# Patient Record
Sex: Female | Born: 1970 | Race: Black or African American | Hispanic: No | Marital: Single | Smoking: Current every day smoker
Health system: Southern US, Community
[De-identification: ages and names within clinical notes are randomized; demographics above are authoritative.]

## PROBLEM LIST (undated history)

## (undated) DIAGNOSIS — J45909 Unspecified asthma, uncomplicated: Secondary | ICD-10-CM

## (undated) HISTORY — PX: ANKLE FRACTURE SURGERY: SHX122

## (undated) HISTORY — PX: TONSILLECTOMY: SUR1361

## (undated) HISTORY — PX: TUBAL LIGATION: SHX77

---

## 1998-12-15 ENCOUNTER — Emergency Department (HOSPITAL_COMMUNITY): Admission: EM | Admit: 1998-12-15 | Discharge: 1998-12-15 | Payer: Self-pay | Admitting: Emergency Medicine

## 1999-01-08 ENCOUNTER — Encounter: Payer: Self-pay | Admitting: Emergency Medicine

## 1999-01-08 ENCOUNTER — Emergency Department (HOSPITAL_COMMUNITY): Admission: EM | Admit: 1999-01-08 | Discharge: 1999-01-08 | Payer: Self-pay | Admitting: Emergency Medicine

## 1999-07-25 ENCOUNTER — Other Ambulatory Visit: Admission: RE | Admit: 1999-07-25 | Discharge: 1999-07-25 | Payer: Self-pay | Admitting: Obstetrics

## 1999-10-13 ENCOUNTER — Emergency Department (HOSPITAL_COMMUNITY): Admission: EM | Admit: 1999-10-13 | Discharge: 1999-10-13 | Payer: Self-pay | Admitting: Emergency Medicine

## 1999-10-16 ENCOUNTER — Emergency Department (HOSPITAL_COMMUNITY): Admission: EM | Admit: 1999-10-16 | Discharge: 1999-10-16 | Payer: Self-pay | Admitting: Emergency Medicine

## 2001-09-06 ENCOUNTER — Emergency Department (HOSPITAL_COMMUNITY): Admission: EM | Admit: 2001-09-06 | Discharge: 2001-09-07 | Payer: Self-pay | Admitting: Emergency Medicine

## 2003-07-11 ENCOUNTER — Emergency Department (HOSPITAL_COMMUNITY): Admission: EM | Admit: 2003-07-11 | Discharge: 2003-07-12 | Payer: Self-pay | Admitting: Emergency Medicine

## 2003-10-29 ENCOUNTER — Emergency Department (HOSPITAL_COMMUNITY): Admission: EM | Admit: 2003-10-29 | Discharge: 2003-10-29 | Payer: Self-pay | Admitting: Emergency Medicine

## 2003-11-23 ENCOUNTER — Emergency Department (HOSPITAL_COMMUNITY): Admission: EM | Admit: 2003-11-23 | Discharge: 2003-11-23 | Payer: Self-pay | Admitting: Emergency Medicine

## 2008-03-24 ENCOUNTER — Emergency Department (HOSPITAL_COMMUNITY): Admission: EM | Admit: 2008-03-24 | Discharge: 2008-03-24 | Payer: Self-pay | Admitting: Family Medicine

## 2008-11-21 ENCOUNTER — Emergency Department (HOSPITAL_COMMUNITY): Admission: EM | Admit: 2008-11-21 | Discharge: 2008-11-21 | Payer: Self-pay | Admitting: Emergency Medicine

## 2008-12-28 ENCOUNTER — Emergency Department (HOSPITAL_COMMUNITY): Admission: EM | Admit: 2008-12-28 | Discharge: 2008-12-28 | Payer: Self-pay | Admitting: Family Medicine

## 2009-08-02 ENCOUNTER — Emergency Department (HOSPITAL_COMMUNITY): Admission: EM | Admit: 2009-08-02 | Discharge: 2009-08-02 | Payer: Self-pay | Admitting: Emergency Medicine

## 2009-09-01 ENCOUNTER — Emergency Department (HOSPITAL_COMMUNITY): Admission: EM | Admit: 2009-09-01 | Discharge: 2009-09-01 | Payer: Self-pay | Admitting: Family Medicine

## 2009-12-16 ENCOUNTER — Emergency Department (HOSPITAL_COMMUNITY)
Admission: EM | Admit: 2009-12-16 | Discharge: 2009-12-16 | Payer: Self-pay | Source: Home / Self Care | Admitting: Emergency Medicine

## 2010-01-16 ENCOUNTER — Ambulatory Visit (HOSPITAL_COMMUNITY)
Admission: RE | Admit: 2010-01-16 | Discharge: 2010-01-16 | Payer: Self-pay | Source: Home / Self Care | Attending: Obstetrics | Admitting: Obstetrics

## 2010-01-18 ENCOUNTER — Ambulatory Visit (HOSPITAL_COMMUNITY)
Admission: RE | Admit: 2010-01-18 | Discharge: 2010-01-18 | Payer: Self-pay | Source: Home / Self Care | Attending: Obstetrics | Admitting: Obstetrics

## 2010-01-20 ENCOUNTER — Inpatient Hospital Stay (HOSPITAL_COMMUNITY)
Admission: EM | Admit: 2010-01-20 | Discharge: 2010-01-22 | Payer: Self-pay | Attending: Orthopedic Surgery | Admitting: Orthopedic Surgery

## 2010-01-20 ENCOUNTER — Emergency Department (HOSPITAL_COMMUNITY)
Admission: EM | Admit: 2010-01-20 | Discharge: 2010-01-20 | Disposition: A | Discharge status: Other Acute Inpatient Hospital | Payer: Self-pay | Source: Home / Self Care | Admitting: Internal Medicine

## 2010-04-09 LAB — URINALYSIS, ROUTINE W REFLEX MICROSCOPIC
Protein, ur: NEGATIVE mg/dL
Specific Gravity, Urine: 1.022 (ref 1.005–1.030)
Urobilinogen, UA: 0.2 mg/dL (ref 0.0–1.0)

## 2010-04-09 LAB — CBC
HCT: 32.2 % — ABNORMAL LOW (ref 36.0–46.0)
HCT: 38.7 % (ref 36.0–46.0)
Hemoglobin: 13 g/dL (ref 12.0–15.0)
Hemoglobin: 12.2 g/dL (ref 12.0–15.0)
MCH: 31.1 pg (ref 26.0–34.0)
MCH: 30.3 pg (ref 26.0–34.0)
MCHC: 33.3 g/dL (ref 30.0–36.0)
MCHC: 33.6 g/dL (ref 30.0–36.0)
MCV: 93.3 fL (ref 78.0–100.0)
MCV: 92.5 fL (ref 78.0–100.0)
Platelets: 245 10*3/uL (ref 150–400)
RBC: 4.18 MIL/uL (ref 3.87–5.11)
RBC: 3.48 MIL/uL — ABNORMAL LOW (ref 3.87–5.11)
RBC: 4.02 MIL/uL (ref 3.87–5.11)
RDW: 12.8 % (ref 11.5–15.5)
RDW: 12.5 % (ref 11.5–15.5)
RDW: 12.6 % (ref 11.5–15.5)
WBC: 10.8 10*3/uL — ABNORMAL HIGH (ref 4.0–10.5)
WBC: 3.9 10*3/uL — ABNORMAL LOW (ref 4.0–10.5)

## 2010-04-09 LAB — URINE CULTURE
Colony Count: NO GROWTH
Culture  Setup Time: 201112250037

## 2010-04-09 LAB — DIFFERENTIAL
Lymphocytes Relative: 55 % — ABNORMAL HIGH (ref 12–46)
Lymphs Abs: 4.3 10*3/uL — ABNORMAL HIGH (ref 0.7–4.0)
Monocytes Relative: 6 % (ref 3–12)
Neutrophils Relative %: 36 % — ABNORMAL LOW (ref 43–77)

## 2010-04-09 LAB — TYPE AND SCREEN: ABO/RH(D): B POS

## 2010-04-09 LAB — PROTIME-INR
INR: 0.95 (ref 0.00–1.49)
Prothrombin Time: 12.4 seconds (ref 11.6–15.2)
Prothrombin Time: 12.9 seconds (ref 11.6–15.2)

## 2010-04-09 LAB — BASIC METABOLIC PANEL
BUN: 10 mg/dL (ref 6–23)
BUN: 4 mg/dL — ABNORMAL LOW (ref 6–23)
CO2: 27 mEq/L (ref 19–32)
Calcium: 8.7 mg/dL (ref 8.4–10.5)
Chloride: 107 mEq/L (ref 96–112)
Creatinine, Ser: 0.56 mg/dL (ref 0.4–1.2)
GFR calc non Af Amer: >60 mL/min (ref >60)
GFR calc non Af Amer: >60 mL/min (ref >60)
Potassium: 3.4 mEq/L — ABNORMAL LOW (ref 3.5–5.1)
Potassium: 3.5 mEq/L (ref 3.5–5.1)
Sodium: 137 mEq/L (ref 135–145)

## 2010-04-09 LAB — URINALYSIS, MICROSCOPIC ONLY
Specific Gravity, Urine: 1.021 (ref 1.005–1.030)
Urobilinogen, UA: 0.2 mg/dL (ref 0.0–1.0)
pH: 6 (ref 5.0–8.0)

## 2010-04-09 LAB — COMPREHENSIVE METABOLIC PANEL
ALT: 21 U/L (ref 0–35)
AST: 21 U/L (ref 0–37)
Albumin: 4 g/dL (ref 3.5–5.2)
Alkaline Phosphatase: 36 U/L — ABNORMAL LOW (ref 39–117)
Calcium: 9.2 mg/dL (ref 8.4–10.5)
Total Bilirubin: 0.4 mg/dL (ref 0.3–1.2)

## 2010-04-09 LAB — PREGNANCY, URINE: Preg Test, Ur: NEGATIVE

## 2010-04-09 LAB — ABO/RH: ABO/RH(D): B POS

## 2010-05-15 LAB — POCT I-STAT, CHEM 8
BUN: 5 mg/dL — ABNORMAL LOW (ref 6–23)
Creatinine, Ser: 0.8 mg/dL (ref 0.4–1.2)
Glucose, Bld: 96 mg/dL (ref 70–99)
Hemoglobin: 16 g/dL — ABNORMAL HIGH (ref 12.0–15.0)
Potassium: 3.6 mEq/L (ref 3.5–5.1)
Sodium: 140 mEq/L (ref 135–145)
TCO2: 27 mmol/L (ref 0–100)

## 2011-03-16 ENCOUNTER — Emergency Department (HOSPITAL_COMMUNITY): Payer: Medicaid Other

## 2011-03-16 ENCOUNTER — Emergency Department (HOSPITAL_COMMUNITY)
Admission: EM | Admit: 2011-03-16 | Discharge: 2011-03-16 | Disposition: A | Discharge status: Home / Self Care | Payer: Medicaid Other | Attending: Emergency Medicine | Admitting: Emergency Medicine

## 2011-03-16 ENCOUNTER — Encounter (HOSPITAL_COMMUNITY): Payer: Self-pay | Admitting: *Deleted

## 2011-03-16 DIAGNOSIS — N898 Other specified noninflammatory disorders of vagina: Secondary | ICD-10-CM | POA: Insufficient documentation

## 2011-03-16 DIAGNOSIS — Z79899 Other long term (current) drug therapy: Secondary | ICD-10-CM | POA: Insufficient documentation

## 2011-03-16 DIAGNOSIS — N949 Unspecified condition associated with female genital organs and menstrual cycle: Secondary | ICD-10-CM | POA: Insufficient documentation

## 2011-03-16 DIAGNOSIS — R109 Unspecified abdominal pain: Secondary | ICD-10-CM | POA: Insufficient documentation

## 2011-03-16 DIAGNOSIS — N739 Female pelvic inflammatory disease, unspecified: Secondary | ICD-10-CM

## 2011-03-16 DIAGNOSIS — R10817 Generalized abdominal tenderness: Secondary | ICD-10-CM | POA: Insufficient documentation

## 2011-03-16 DIAGNOSIS — F17200 Nicotine dependence, unspecified, uncomplicated: Secondary | ICD-10-CM | POA: Insufficient documentation

## 2011-03-16 LAB — WET PREP, GENITAL
Trich, Wet Prep: NONE SEEN
Yeast Wet Prep HPF POC: NONE SEEN

## 2011-03-16 LAB — LIPASE, BLOOD: Lipase: 39 U/L (ref 11–59)

## 2011-03-16 LAB — DIFFERENTIAL
Basophils Absolute: 0 10*3/uL (ref 0.0–0.1)
Basophils Relative: 0 % (ref 0–1)
Eosinophils Absolute: 0.4 10*3/uL (ref 0.0–0.7)
Eosinophils Relative: 7 % — ABNORMAL HIGH (ref 0–5)
Lymphocytes Relative: 50 % — ABNORMAL HIGH (ref 12–46)
Lymphs Abs: 2.8 10*3/uL (ref 0.7–4.0)
Monocytes Absolute: 0.4 10*3/uL (ref 0.1–1.0)
Monocytes Relative: 8 % (ref 3–12)
Neutro Abs: 2 10*3/uL (ref 1.7–7.7)
Neutrophils Relative %: 35 % — ABNORMAL LOW (ref 43–77)

## 2011-03-16 LAB — URINALYSIS, ROUTINE W REFLEX MICROSCOPIC
Bilirubin Urine: NEGATIVE
Glucose, UA: NEGATIVE mg/dL
Hgb urine dipstick: NEGATIVE
Ketones, ur: NEGATIVE mg/dL
Nitrite: NEGATIVE
Protein, ur: NEGATIVE mg/dL
Specific Gravity, Urine: 1.014 (ref 1.005–1.030)
Urobilinogen, UA: 0.2 mg/dL (ref 0.0–1.0)
pH: 7 (ref 5.0–8.0)

## 2011-03-16 LAB — CBC
HCT: 37.3 % (ref 36.0–46.0)
Hemoglobin: 12.8 g/dL (ref 12.0–15.0)
MCH: 31 pg (ref 26.0–34.0)
MCHC: 34.3 g/dL (ref 30.0–36.0)
MCV: 90.3 fL (ref 78.0–100.0)
Platelets: 290 10*3/uL (ref 150–400)
RBC: 4.13 MIL/uL (ref 3.87–5.11)
RDW: 12.7 % (ref 11.5–15.5)
WBC: 5.6 10*3/uL (ref 4.0–10.5)

## 2011-03-16 LAB — COMPREHENSIVE METABOLIC PANEL
ALT: 14 U/L (ref 0–35)
AST: 16 U/L (ref 0–37)
Albumin: 3.6 g/dL (ref 3.5–5.2)
Alkaline Phosphatase: 44 U/L (ref 39–117)
BUN: 7 mg/dL (ref 6–23)
CO2: 25 mEq/L (ref 19–32)
Calcium: 9.2 mg/dL (ref 8.4–10.5)
Chloride: 105 mEq/L (ref 96–112)
Creatinine, Ser: 0.6 mg/dL (ref 0.50–1.10)
GFR calc Af Amer: >90 mL/min (ref >90)
GFR calc non Af Amer: >90 mL/min (ref >90)
Glucose, Bld: 98 mg/dL (ref 70–99)
Potassium: 3.8 mEq/L (ref 3.5–5.1)
Sodium: 138 mEq/L (ref 135–145)
Total Bilirubin: 0.3 mg/dL (ref 0.3–1.2)
Total Protein: 6.6 g/dL (ref 6.0–8.3)

## 2011-03-16 LAB — URINE MICROSCOPIC-ADD ON

## 2011-03-16 MED ORDER — HYDROMORPHONE HCL PF 1 MG/ML IJ SOLN
1.0000 mg | Freq: Once | INTRAMUSCULAR | Status: AC
  Administered 2011-03-16: 1 mg via INTRAVENOUS
  Filled 2011-03-16: qty 1

## 2011-03-16 MED ORDER — DEXTROSE 5 % IV SOLN
INTRAVENOUS | Status: AC
  Administered 2011-03-16: 1 g via INTRAVENOUS
  Filled 2011-03-16: qty 10

## 2011-03-16 MED ORDER — ONDANSETRON HCL 4 MG/2ML IJ SOLN
4.0000 mg | Freq: Once | INTRAMUSCULAR | Status: AC
  Administered 2011-03-16: 4 mg via INTRAVENOUS
  Filled 2011-03-16 (×2): qty 2

## 2011-03-16 MED ORDER — IOHEXOL 300 MG/ML  SOLN
100.0000 mL | Freq: Once | INTRAMUSCULAR | Status: AC | PRN
  Administered 2011-03-16: 100 mL via INTRAVENOUS

## 2011-03-16 MED ORDER — OXYCODONE-ACETAMINOPHEN 5-325 MG PO TABS: 1.0000 | ORAL_TABLET | Freq: Four times a day (QID) | ORAL | Status: AC | PRN

## 2011-03-16 MED ORDER — DOXYCYCLINE HYCLATE 100 MG PO TABS
100.0000 mg | ORAL_TABLET | Freq: Once | ORAL | Status: AC
  Administered 2011-03-16: 100 mg via ORAL
  Filled 2011-03-16: qty 1

## 2011-03-16 MED ORDER — HYDROMORPHONE HCL PF 1 MG/ML IJ SOLN
1.0000 mg | Freq: Once | INTRAMUSCULAR | Status: AC
  Administered 2011-03-16: 1 mg via INTRAVENOUS
  Filled 2011-03-16 (×2): qty 1

## 2011-03-16 MED ORDER — CEFTRIAXONE SODIUM 250 MG IJ SOLR: 250.0000 mg | Freq: Once | INTRAMUSCULAR | Status: DC

## 2011-03-16 MED ORDER — ONDANSETRON HCL 4 MG/2ML IJ SOLN
4.0000 mg | Freq: Once | INTRAMUSCULAR | Status: AC
  Administered 2011-03-16: 4 mg via INTRAVENOUS
  Filled 2011-03-16: qty 2

## 2011-03-16 MED ORDER — METRONIDAZOLE 500 MG PO TABS
500.0000 mg | ORAL_TABLET | Freq: Once | ORAL | Status: AC
  Administered 2011-03-16: 500 mg via ORAL
  Filled 2011-03-16: qty 1

## 2011-03-16 MED ORDER — METRONIDAZOLE 500 MG PO TABS: 500.0000 mg | ORAL_TABLET | Freq: Two times a day (BID) | ORAL | Status: AC

## 2011-03-16 MED ORDER — DOXYCYCLINE HYCLATE 100 MG PO CAPS: 100.0000 mg | ORAL_CAPSULE | Freq: Two times a day (BID) | ORAL | Status: AC

## 2011-03-16 MED ORDER — OXYCODONE-ACETAMINOPHEN 5-325 MG PO TABS
1.0000 | ORAL_TABLET | Freq: Once | ORAL | Status: AC
  Administered 2011-03-16: 1 via ORAL
  Filled 2011-03-16: qty 1

## 2011-03-16 NOTE — Discharge Instructions (Signed)
Follow up with your GYN doctor. Return here as needed. I think you have an infection in your pelvic region.

## 2011-03-16 NOTE — ED Provider Notes (Signed)
History     CSN: [id number]  Arrival date & time 03/16/11  4098   First MD Initiated Contact with Patient 03/16/11 [phone]      Chief Complaint  Patient presents with  . Abdominal Pain  . Shoulder Pain    (Consider location/radiation/quality/duration/timing/severity/associated sxs/prior treatment) HPI The patient presents with lower abdominal pain since Thursday. She states that now her abdomen is painful generally at this point but mostly in her lower abdomen. The patient denies N/V/D, abdominal distention, urinary symptoms, weakness, fever, vaginal bleeding or discharge. She states that she has not taken any medication to help with her symptoms. She is sexauly active.  History reviewed. No pertinent past medical history.  History reviewed. No pertinent past surgical history.  History reviewed. No pertinent family history.  History  Substance Use Topics  . Smoking status: Current Everyday Smoker -- 1.0 packs/day    Types: Cigarettes  . Smokeless tobacco: Not on file  . Alcohol Use: No    OB History    Grav Para Term Preterm Abortions TAB SAB Ect Mult Living                  Review of Systems All pertinent positives and negatives reviewed in the history of present illness  Allergies  Nsaids  Home Medications   Current Outpatient Rx  Name Route Sig Dispense Refill  . DIPHENHYDRAMINE HCL 25 MG PO TABS Oral Take 25 mg by mouth every 6 (six) hours as needed. Allergies    . TRAMADOL HCL 50 MG PO TABS Oral Take 150 mg by mouth every 8 (eight) hours as needed. pain      BP 131/77  Pulse 73  Temp(Src) 98.1 F (36.7 C) (Oral)  Resp 20  SpO2 95%  LMP 02/17/2011  Physical Exam  Constitutional: She is oriented to person, place, and time. She appears well-developed and well-nourished. No distress.  HENT:  Head: Normocephalic and atraumatic.  Eyes: Pupils are equal, round, and reactive to light.  Neck: Normal range of motion. Neck supple.  Cardiovascular: Normal rate,  regular rhythm and normal heart sounds.  Exam reveals no gallop and no friction rub.   No murmur heard. Pulmonary/Chest: Effort normal and breath sounds normal. No respiratory distress. She has no rales.  Abdominal: Soft. Normal appearance and bowel sounds are normal. She exhibits no distension. There is no hepatosplenomegaly. There is generalized tenderness. There is no rigidity, no rebound and no guarding. No hernia.    Genitourinary: Vagina normal. Cervix exhibits discharge. Right adnexum displays tenderness. Left adnexum displays tenderness.  Neurological: She is alert and oriented to person, place, and time.  Skin: Skin is warm and dry. No rash noted.    ED Course  Procedures (including critical care time)  Labs Reviewed  DIFFERENTIAL - Abnormal; Notable for the following:    Neutrophils Relative 35 (*)    Lymphocytes Relative 50 (*)    Eosinophils Relative 7 (*)    All other components within normal limits  URINALYSIS, ROUTINE W REFLEX MICROSCOPIC - Abnormal; Notable for the following:    APPearance CLOUDY (*)    Leukocytes, UA TRACE (*)    All other components within normal limits  URINE MICROSCOPIC-ADD ON - Abnormal; Notable for the following:    Squamous Epithelial / LPF MANY (*)    All other components within normal limits  CBC  COMPREHENSIVE METABOLIC PANEL  LIPASE, BLOOD  WET PREP, GENITAL  GC/CHLAMYDIA PROBE AMP, GENITAL  PREGNANCY, URINE   Ct Abdomen  Pelvis W Contrast  03/16/2011  *RADIOLOGY REPORT*  Clinical Data: Abdominal pain  CT ABDOMEN AND PELVIS WITH CONTRAST  Technique:  Multidetector CT imaging of the abdomen and pelvis was performed following the standard protocol during bolus administration of intravenous contrast.  Contrast: OMNIPAQUE IOHEXOL 300 MG/ML IV SOLN  Comparison: None.  Findings: The lung bases are clear.  The liver demonstrates mild diffuse fatty infiltration.  No focal hepatic lesion or biliary ductal dilatation. The gallbladder, spleen,  adrenal glands, pancreas, and kidneys are within normal limits.  On delayed images, there is symmetric excretion of contrast both kidneys.  Ureters are normal in caliber.  The stomach is not distended and appears normal.  Small bowel loops are normal in caliber and wall thickness.  The terminal ileum appears normal. The appendix is normal. The colon is normal in caliber and wall thickness.  Normal rectum.  The urinary bladder is normal.  The uterus and adnexa are within normal limits.  There is no inflammatory stranding, ascites, or lymphadenopathy. Negative for free fluid.  Soft tissues of the body wall are unremarkable.  Vertebral bodies are normal in height and alignment. Sacroiliac joints and hips are within normal limits.  IMPRESSION:  1.  No acute findings in the abdomen or pelvis to explain the patient's pain. 2.  Mild fatty infiltration of the liver.  Original Report Authenticated By: Britta Mccreedy, M.D.    Based on her PE I feel that the patient has PID. The patient will be treated for this and referred to her GYN doctor for a recheck.     MDM   MDM Reviewed: nursing note and vitals Interpretation: labs and CT scan           Carlyle Dolly, PA-C 03/16/11 1614

## 2011-03-16 NOTE — ED Notes (Signed)
Reports having lower abd pain that woke her up, having a burning pain, denies urinary symptoms, denies vaginal symptoms or n/v/d. Also reports right shoulder pain, denies injury.

## 2011-03-16 NOTE — ED Notes (Signed)
Gave pt a blanket.8:25am JG.

## 2011-03-17 NOTE — ED Provider Notes (Signed)
Medical screening examination/treatment/procedure(s) were performed by non-physician practitioner and as supervising physician I was immediately available for consultation/collaboration.  Loren Racer, MD 03/17/11 [phone]

## 2011-03-18 LAB — GC/CHLAMYDIA PROBE AMP, GENITAL
Chlamydia, DNA Probe: NEGATIVE
GC Probe Amp, Genital: NEGATIVE

## 2011-03-18 LAB — POCT I-STAT TROPONIN I

## 2012-08-13 ENCOUNTER — Emergency Department (HOSPITAL_COMMUNITY)
Admission: EM | Admit: 2012-08-13 | Discharge: 2012-08-13 | Disposition: A | Discharge status: Home / Self Care | Payer: Self-pay | Attending: Emergency Medicine | Admitting: Emergency Medicine

## 2012-08-13 ENCOUNTER — Emergency Department (HOSPITAL_COMMUNITY): Payer: Self-pay

## 2012-08-13 ENCOUNTER — Encounter (HOSPITAL_COMMUNITY): Payer: Self-pay

## 2012-08-13 DIAGNOSIS — R059 Cough, unspecified: Secondary | ICD-10-CM

## 2012-08-13 DIAGNOSIS — M546 Pain in thoracic spine: Secondary | ICD-10-CM | POA: Insufficient documentation

## 2012-08-13 DIAGNOSIS — R05 Cough: Secondary | ICD-10-CM | POA: Insufficient documentation

## 2012-08-13 DIAGNOSIS — F17200 Nicotine dependence, unspecified, uncomplicated: Secondary | ICD-10-CM | POA: Insufficient documentation

## 2012-08-13 DIAGNOSIS — R0989 Other specified symptoms and signs involving the circulatory and respiratory systems: Secondary | ICD-10-CM | POA: Insufficient documentation

## 2012-08-13 DIAGNOSIS — R079 Chest pain, unspecified: Secondary | ICD-10-CM | POA: Insufficient documentation

## 2012-08-13 DIAGNOSIS — J45901 Unspecified asthma with (acute) exacerbation: Secondary | ICD-10-CM | POA: Insufficient documentation

## 2012-08-13 DIAGNOSIS — M549 Dorsalgia, unspecified: Secondary | ICD-10-CM

## 2012-08-13 HISTORY — DX: Unspecified asthma, uncomplicated: J45.909

## 2012-08-13 LAB — BASIC METABOLIC PANEL
CO2: 26 mEq/L (ref 19–32)
Chloride: 104 mEq/L (ref 96–112)
Sodium: 139 mEq/L (ref 135–145)

## 2012-08-13 LAB — CBC WITH DIFFERENTIAL/PLATELET
Basophils Absolute: 0 10*3/uL (ref 0.0–0.1)
HCT: 36.1 % (ref 36.0–46.0)
Lymphocytes Relative: 40 % (ref 12–46)
Lymphs Abs: 3.3 10*3/uL (ref 0.7–4.0)
Monocytes Absolute: 0.5 10*3/uL (ref 0.1–1.0)
Neutro Abs: 3.9 10*3/uL (ref 1.7–7.7)
Platelets: 275 10*3/uL (ref 150–400)
RBC: 3.97 MIL/uL (ref 3.87–5.11)
RDW: 13 % (ref 11.5–15.5)
WBC: 8.1 10*3/uL (ref 4.0–10.5)

## 2012-08-13 MED ORDER — TRAMADOL HCL 50 MG PO TABS: 50.0000 mg | ORAL_TABLET | Freq: Four times a day (QID) | ORAL | Status: DC | PRN

## 2012-08-13 MED ORDER — HYDROCODONE-ACETAMINOPHEN 5-325 MG PO TABS
1.0000 | ORAL_TABLET | Freq: Once | ORAL | Status: AC
  Administered 2012-08-13: 1 via ORAL
  Filled 2012-08-13: qty 1

## 2012-08-13 NOTE — ED Provider Notes (Signed)
History    CSN: [id number] Arrival date & time 08/13/12  1400  First MD Initiated Contact with Patient 08/13/12 1512     Chief Complaint  Patient presents with  . Shortness of Breath  . Cough  . Chest Pain   (Consider location/radiation/quality/duration/timing/severity/associated sxs/prior Treatment) Patient is a 42 y.o. female presenting with shortness of breath, cough, and chest pain. The history is provided by the patient.  Shortness of Breath Associated symptoms: chest pain and cough   Associated symptoms: no abdominal pain, no fever, no headaches, no neck pain, no rash and no vomiting   Cough Associated symptoms: chest pain and shortness of breath   Associated symptoms: no chills, no fever, no headaches and no rash   Chest Pain Associated symptoms: cough and shortness of breath   Associated symptoms: no abdominal pain, no fever, no headache and not vomiting   pt c/o right sided chest pain, non productive cough, chest congestion, onset in past day. States non productive cough, no hemoptysis.  w coughing spell, notes right upper back pain, no anterior cp.  No change w activity or exertion. No associated sob, nv or diaphoresis. No fever or chills. Denies back or chest wall strain. Denies leg pain or swelling. No recent immobility, trauma, surgery, or travel. No hx dvt or pe. Smoker. No family hx premature cad. Pt notes hx asthma. States last exacerbation was 1-2 years ago.    Past Medical History  Diagnosis Date  . Asthma    Past Surgical History  Procedure Laterality Date  . Tubal ligation    . Ankle fracture surgery    . Tonsillectomy     Family History  Problem Relation Age of Onset  . Hypertension Mother    History  Substance Use Topics  . Smoking status: Current Every Day Smoker -- 1.00 packs/day    Types: Cigarettes  . Smokeless tobacco: Never Used  . Alcohol Use: No   OB History   Grav Para Term Preterm Abortions TAB SAB Ect Mult Living                  Review of Systems  Constitutional: Negative for fever and chills.  HENT: Negative for neck pain.   Eyes: Negative for redness.  Respiratory: Positive for cough and shortness of breath.   Cardiovascular: Positive for chest pain. Negative for leg swelling.  Gastrointestinal: Negative for vomiting and abdominal pain.  Genitourinary: Negative for flank pain.  Musculoskeletal: Negative for arthralgias.  Skin: Negative for rash.  Neurological: Negative for headaches.  Hematological: Does not bruise/bleed easily.  Psychiatric/Behavioral: Negative for confusion.    Allergies  Nsaids  Home Medications   Current Outpatient Rx  Name  Route  Sig  Dispense  Refill  . diphenhydrAMINE (BENADRYL) 25 MG tablet   Oral   Take 25 mg by mouth every 6 (six) hours as needed for allergies.           BP 136/83  Pulse 82  Temp(Src) 99.4 F (37.4 C) (Oral)  Resp 15  Ht 5' 6.5" (1.689 m)  Wt 190 lb (86.183 kg)  BMI 30.21 kg/m2  SpO2 100%  LMP 08/13/2012 Physical Exam  Nursing note and vitals reviewed. Constitutional: She appears well-developed and well-nourished. No distress.  HENT:  Mouth/Throat: Oropharynx is clear and moist.  Eyes: Conjunctivae are normal. No scleral icterus.  Neck: Neck supple. No tracheal deviation present.  Cardiovascular: Normal rate, regular rhythm, normal heart sounds and intact distal pulses.  Exam reveals  no gallop and no friction rub.   No murmur heard. Pulmonary/Chest: Effort normal and breath sounds normal. No respiratory distress. She exhibits no tenderness.  Abdominal: Soft. Normal appearance and bowel sounds are normal. She exhibits no distension. There is no tenderness.  Genitourinary:  No cva tenderness  Musculoskeletal: She exhibits no edema and no tenderness.  Right upper back muscular tenderness.   Neurological: She is alert.  Skin: Skin is warm and dry. No rash noted. She is not diaphoretic.  Psychiatric: She has a normal mood and affect.     ED Course  Procedures (including critical care time)  Results for orders placed during the hospital encounter of 08/13/12  CBC WITH DIFFERENTIAL      Result Value Range   WBC 8.1  4.0 - 10.5 K/uL   RBC 3.97  3.87 - 5.11 MIL/uL   Hemoglobin 12.3  12.0 - 15.0 g/dL   HCT 45.4  09.8 - 11.9 %   MCV 90.9  78.0 - 100.0 fL   MCH 31.0  26.0 - 34.0 pg   MCHC 34.1  30.0 - 36.0 g/dL   RDW 14.7  82.9 - 56.2 %   Platelets 275  150 - 400 K/uL   Neutrophils Relative % 48  43 - 77 %   Neutro Abs 3.9  1.7 - 7.7 K/uL   Lymphocytes Relative 40  12 - 46 %   Lymphs Abs 3.3  0.7 - 4.0 K/uL   Monocytes Relative 6  3 - 12 %   Monocytes Absolute 0.5  0.1 - 1.0 K/uL   Eosinophils Relative 5  0 - 5 %   Eosinophils Absolute 0.4  0.0 - 0.7 K/uL   Basophils Relative 0  0 - 1 %   Basophils Absolute 0.0  0.0 - 0.1 K/uL  BASIC METABOLIC PANEL      Result Value Range   Sodium 139  135 - 145 mEq/L   Potassium 4.0  3.5 - 5.1 mEq/L   Chloride 104  96 - 112 mEq/L   CO2 26  19 - 32 mEq/L   Glucose, Bld 147 (*) 70 - 99 mg/dL   BUN 8  6 - 23 mg/dL   Creatinine, Ser 1.30  0.50 - 1.10 mg/dL   Calcium 9.5  8.4 - 86.5 mg/dL   GFR calc non Af Amer >90  >90 mL/min   GFR calc Af Amer >90  >90 mL/min  TROPONIN I      Result Value Range   Troponin I <0.30  <0.30 ng/mL   Dg Chest 2 View  08/13/2012   *RADIOLOGY REPORT*  Clinical Data: Shortness of breath and cough  CHEST - 2 VIEW  Comparison: 01/20/2010  Findings: The heart size and mediastinal contours are within normal limits.  Both lungs are clear.  The visualized skeletal structures are unremarkable.  IMPRESSION: No active cardiopulmonary abnormalities.   Original Report Authenticated By: Signa Kell, M.D.     MDM  Iv ns. Labs. Cxr.   Date: 08/13/2012  Rate: 84  Rhythm: normal sinus rhythm  QRS Axis: normal  Intervals: normal  ST/T Wave abnormalities: normal  Conduction Disutrbances:none  Narrative Interpretation:   Old EKG Reviewed:  unchanged  Reviewed nursing notes and prior charts for additional history.   Pt states has ride, does not have to drive.  Allergy to nsaids. vicodin 1 po.  Pt resting comfortably on recheck. Hr 70, rr 14, pulse ox 99% room air.  Pt appears stable for d/c.  Suzi Roots, MD 08/13/12 [phone]

## 2012-08-13 NOTE — ED Notes (Addendum)
Patient reports non productive cough that began last night and right chest pain.  Patient states she coughs with a deep breath.Lungs clear. Patient states she called EMS last pm and EMS gave her a neb treatment and she was breathiig better when they left.

## 2012-10-23 ENCOUNTER — Encounter (HOSPITAL_COMMUNITY): Payer: Self-pay

## 2012-10-23 ENCOUNTER — Emergency Department (HOSPITAL_COMMUNITY)
Admission: EM | Admit: 2012-10-23 | Discharge: 2012-10-23 | Disposition: A | Discharge status: Home / Self Care | Payer: Medicaid Other | Attending: Emergency Medicine | Admitting: Emergency Medicine

## 2012-10-23 ENCOUNTER — Emergency Department (HOSPITAL_COMMUNITY): Payer: Medicaid Other

## 2012-10-23 DIAGNOSIS — F17200 Nicotine dependence, unspecified, uncomplicated: Secondary | ICD-10-CM | POA: Insufficient documentation

## 2012-10-23 DIAGNOSIS — R6883 Chills (without fever): Secondary | ICD-10-CM | POA: Insufficient documentation

## 2012-10-23 DIAGNOSIS — Z888 Allergy status to other drugs, medicaments and biological substances status: Secondary | ICD-10-CM | POA: Insufficient documentation

## 2012-10-23 DIAGNOSIS — R079 Chest pain, unspecified: Secondary | ICD-10-CM | POA: Insufficient documentation

## 2012-10-23 DIAGNOSIS — J019 Acute sinusitis, unspecified: Secondary | ICD-10-CM | POA: Insufficient documentation

## 2012-10-23 DIAGNOSIS — J45909 Unspecified asthma, uncomplicated: Secondary | ICD-10-CM | POA: Insufficient documentation

## 2012-10-23 DIAGNOSIS — Z79899 Other long term (current) drug therapy: Secondary | ICD-10-CM | POA: Insufficient documentation

## 2012-10-23 DIAGNOSIS — J209 Acute bronchitis, unspecified: Secondary | ICD-10-CM | POA: Insufficient documentation

## 2012-10-23 MED ORDER — ALBUTEROL SULFATE (5 MG/ML) 0.5% IN NEBU
5.0000 mg | INHALATION_SOLUTION | Freq: Once | RESPIRATORY_TRACT | Status: AC
  Administered 2012-10-23: 5 mg via RESPIRATORY_TRACT
  Filled 2012-10-23: qty 1

## 2012-10-23 MED ORDER — PREDNISONE 20 MG PO TABS: 60.0000 mg | ORAL_TABLET | Freq: Every day | ORAL | Status: DC

## 2012-10-23 MED ORDER — AZITHROMYCIN 250 MG PO TABS: 250.0000 mg | ORAL_TABLET | Freq: Every day | ORAL | Status: DC

## 2012-10-23 MED ORDER — ALBUTEROL SULFATE HFA 108 (90 BASE) MCG/ACT IN AERS: 1.0000 | INHALATION_SPRAY | Freq: Four times a day (QID) | RESPIRATORY_TRACT | Status: DC | PRN

## 2012-10-23 NOTE — ED Provider Notes (Signed)
CSN: [id number]     Arrival date & time 10/23/12  1245 History  This chart was scribed for non-physician practitioner Magnus Sinning, PA-C working with Loren Racer, MD by Danella Maiers, ED Scribe. This patient was seen in room TR06C/TR06C and the patient's care was started at 2:03 PM.     Chief Complaint  Patient presents with  . Cough  . Nasal Congestion   The history is provided by the patient. No language interpreter was used.   HPI Comments: Tricia Hood is a 42 y.o. female with a history of asthma who presents to the Emergency Department complaining of CP only when she coughs onset 4 days ago. She also reports sinus pain, chills, and congestion. She reports yellow sputum when she coughs. She denies h/o chronic bronchitis. She is out of her albuterol inhaler. She has not taken anything for her symptoms prior to arrival.  She denies fever. She feels better after receiving a breathing treatment in Triage.  She currently smokes 1 ppd.   Past Medical History  Diagnosis Date  . Asthma    Past Surgical History  Procedure Laterality Date  . Tubal ligation    . Ankle fracture surgery    . Tonsillectomy     Family History  Problem Relation Age of Onset  . Hypertension Mother    History  Substance Use Topics  . Smoking status: Current Every Day Smoker -- 1.00 packs/day    Types: Cigarettes  . Smokeless tobacco: Never Used  . Alcohol Use: No   OB History   Grav Para Term Preterm Abortions TAB SAB Ect Mult Living                 Review of Systems  Constitutional: Positive for chills. Negative for fever.  HENT: Positive for congestion.   Respiratory: Positive for cough.   All other systems reviewed and are negative.    Allergies  Nsaids  Home Medications   Current Outpatient Rx  Name  Route  Sig  Dispense  Refill  . albuterol (PROVENTIL HFA;VENTOLIN HFA) 108 (90 BASE) MCG/ACT inhaler   Inhalation   Inhale 2 puffs into the lungs every 6 (six) hours as  needed for wheezing.         Marland Kitchen OVER THE COUNTER MEDICATION   Oral   Take 1 tablet by mouth daily as needed (alleregies).          BP 136/98  Pulse 86  Temp(Src) 98.1 F (36.7 C) (Oral)  Resp 16  Ht 5\' 5"  (1.651 m)  Wt 190 lb (86.183 kg)  BMI 31.62 kg/m2  SpO2 99%  LMP 09/16/2012 Physical Exam  Nursing note and vitals reviewed. Constitutional: She appears well-developed and well-nourished.  HENT:  Head: Normocephalic and atraumatic.  Right Ear: Tympanic membrane normal.  Left Ear: Tympanic membrane normal.  Mouth/Throat: Oropharynx is clear and moist.  Tenderness to palpation of the maxillary and frontal sinuses.  Eyes: EOM are normal. Pupils are equal, round, and reactive to light.  Neck: Normal range of motion. Neck supple.  Cardiovascular: Normal rate, regular rhythm and normal heart sounds.   Pulmonary/Chest: Effort normal and breath sounds normal. She has no wheezes.  rhonchi at the base of the lungs. Late inspiratory wheeze at the base of the left lung.  Musculoskeletal: Normal range of motion.  Neurological: She is alert.  Skin: Skin is warm and dry.  Psychiatric: She has a normal mood and affect. Her behavior is normal.  ED Course  Procedures (including critical care time) Medications  albuterol (PROVENTIL) (5 MG/ML) 0.5% nebulizer solution 5 mg (5 mg Nebulization Given 10/23/12 1315)   DIAGNOSTIC STUDIES: Oxygen Saturation is 99% on room air, normal by my interpretation.    COORDINATION OF CARE: 2:50 PM- Discussed treatment plan with pt which includes CXR and albuterol inhaler and pt agrees to plan.   Labs Review Labs Reviewed - No data to display Imaging Review Dg Chest 2 View  10/23/2012   CLINICAL DATA:  Cough. Congestion. Tobacco use.  EXAM: CHEST  2 VIEW  COMPARISON:  08/13/2012  FINDINGS: The heart size and mediastinal contours are within normal limits. Both lungs are clear. The visualized skeletal structures are unremarkable.  IMPRESSION: No  active cardiopulmonary disease.   Electronically Signed   By: Herbie Baltimore   On: 10/23/2012 13:35    MDM  No diagnosis found. Patient with symptoms consistent with Acute Sinusitis and Acute Bronchitis.  Patient given prescription for Azithromycin, Albuterol, and Prednisone.  Pulse ox 99.  No signs of respiratory distress.  Patient stable for discharge.  I personally performed the services described in this documentation, which was scribed in my presence. The recorded information has been reviewed and is accurate.    Pascal Lux Chester, PA-C 10/23/12 [phone]

## 2012-10-23 NOTE — ED Notes (Signed)
Pt c/o productive cough, nasal congestion, and chills x4 days

## 2012-10-23 NOTE — ED Notes (Signed)
Patient transported to X-ray 

## 2012-10-25 NOTE — ED Provider Notes (Signed)
Medical screening examination/treatment/procedure(s) were performed by non-physician practitioner and as supervising physician I was immediately available for consultation/collaboration.   Conall Vangorder, MD 10/25/12 1506 

## 2013-03-08 ENCOUNTER — Ambulatory Visit (INDEPENDENT_AMBULATORY_CARE_PROVIDER_SITE_OTHER): Payer: Managed Care, Other (non HMO) | Admitting: Family Medicine

## 2013-03-08 VITALS — BP 122/78 | HR 86 | Temp 97.9°F | Resp 16 | Ht 65.5 in | Wt 224.6 lb

## 2013-03-08 DIAGNOSIS — L299 Pruritus, unspecified: Secondary | ICD-10-CM

## 2013-03-08 DIAGNOSIS — T7840XA Allergy, unspecified, initial encounter: Secondary | ICD-10-CM

## 2013-03-08 MED ORDER — PREDNISONE 20 MG PO TABS: ORAL_TABLET | ORAL | Status: DC

## 2013-03-08 MED ORDER — METHYLPREDNISOLONE ACETATE 80 MG/ML IJ SUSP
40.0000 mg | Freq: Once | INTRAMUSCULAR | Status: AC
  Administered 2013-03-08: 40 mg via INTRAMUSCULAR

## 2013-03-08 MED ORDER — HYDROXYZINE HCL 25 MG PO TABS: 25.0000 mg | ORAL_TABLET | Freq: Every evening | ORAL | Status: DC | PRN

## 2013-03-08 NOTE — Progress Notes (Signed)
Tricia Hood is a 43 y.o. female who presents to Urgent Care today with complaints of itching and hives:  1.  Itching and hives:  Started on Firday.  Was at her mother's house who made fish, which she is allergic to.  Did not eat any but unsure if she cooked with same utensils.  Started Friday night with hives upper extremities, back, shoulders.  Progressed over the weekend to include face and legs.  No relief, unable to sleep at night due to itching.  Dry mouth but no trouble with airway.  Eating and drinking well.  Some decreased urination since starting the Benadryl.  Has taken 25 mg Benadryl every 6 hours for past 3 days without relief.  No nausea or vomitng.    Used inhaler 3 times today, which is normal for her.  No increased SOB since this stated  PMH reviewed.  Past Medical History  Diagnosis Date  . Asthma    Past Surgical History  Procedure Laterality Date  . Tubal ligation    . Ankle fracture surgery    . Tonsillectomy      Medications reviewed. Current Outpatient Prescriptions  Medication Sig Dispense Refill  . albuterol (PROVENTIL HFA;VENTOLIN HFA) 108 (90 BASE) MCG/ACT inhaler Inhale 2 puffs into the lungs every 6 (six) hours as needed for wheezing.      Marland Kitchen. albuterol (PROVENTIL HFA;VENTOLIN HFA) 108 (90 BASE) MCG/ACT inhaler Inhale 1-2 puffs into the lungs every 6 (six) hours as needed for wheezing.  1 Inhaler  0  . azithromycin (ZITHROMAX) 250 MG tablet Take 1 tablet (250 mg total) by mouth daily. Take first 2 tablets together, then 1 every day until finished.  6 tablet  0  . OVER THE COUNTER MEDICATION Take 1 tablet by mouth daily as needed (alleregies).      . predniSONE (DELTASONE) 20 MG tablet Take 3 tablets (60 mg total) by mouth daily.  15 tablet  0   No current facility-administered medications for this visit.    ROS as above otherwise neg.  No chest pain, palpitations, SOB, Fever, Chills, Abd pain, N/V/D.   Physical Exam:  BP 122/78  Pulse 86   Temp(Src) 97.9 F (36.6 C) (Oral)  Resp 16  Ht 5' 5.5" (1.664 m)  Wt 224 lb 9.6 oz (101.878 kg)  BMI 36.79 kg/m2  SpO2 99%  LMP 03/05/2013 Gen:  Alert, cooperative patient who appears stated age in no acute distress.  Vital signs reviewed. HEENT: EOMI.  Dry mucus membranes.  Tonsils +1 BL.  No other glandular swelling noted. Neck:  Trachea midline Pulm:  Clear to auscultation bilaterally with good air movement.  No wheezes or rales noted.  No increased WOB Skin:  Multiple patches of urticaria and papules scattered across upper chest, back, BL arms, legs.  Also with papules scattered across face.     Assessment and Plan: 1.  Allergic reaction: - likely to fish, though unknown - Depomedrol provided here in clinic - Prednisone taper outpatient basis plus Atarax for relief - no evidence of airway/respiratory involvement.    2.  Antihistamine overuse: - anticholinergic symptoms Stop all benadryl. Atarax PRN QHS for bad itching

## 2013-03-08 NOTE — Patient Instructions (Signed)
Talke the Prednisone as directed on the bottle.  Take the Atarax at night if you need it.  Stop the Benadryl.  If you start having any trouble breathing or swallowing go to the ED immediately.

## 2013-03-09 ENCOUNTER — Telehealth: Payer: Self-pay

## 2013-03-09 MED ORDER — HYDROXYZINE HCL 25 MG PO TABS: 25.0000 mg | ORAL_TABLET | Freq: Every evening | ORAL | Status: DC | PRN

## 2013-03-09 NOTE — Telephone Encounter (Signed)
Pt is needing to talk with someone about a prescribtion she was prescribed yesterday that is not at the pharmacy it is for itching   Best number [phone]

## 2013-03-09 NOTE — Telephone Encounter (Signed)
Pt called back- her pharm did not receive the rx for Vistaril. Resent.

## 2013-03-09 NOTE — Telephone Encounter (Signed)
Left message to return call 

## 2013-09-30 ENCOUNTER — Encounter (HOSPITAL_COMMUNITY): Payer: Self-pay | Admitting: Emergency Medicine

## 2013-09-30 ENCOUNTER — Emergency Department (HOSPITAL_COMMUNITY)
Admission: EM | Admit: 2013-09-30 | Discharge: 2013-09-30 | Disposition: A | Discharge status: Home / Self Care | Payer: Medicaid Other | Attending: Emergency Medicine | Admitting: Emergency Medicine

## 2013-09-30 DIAGNOSIS — T148XXA Other injury of unspecified body region, initial encounter: Principal | ICD-10-CM

## 2013-09-30 DIAGNOSIS — F17200 Nicotine dependence, unspecified, uncomplicated: Secondary | ICD-10-CM | POA: Insufficient documentation

## 2013-09-30 DIAGNOSIS — Z792 Long term (current) use of antibiotics: Secondary | ICD-10-CM | POA: Insufficient documentation

## 2013-09-30 DIAGNOSIS — S6990XA Unspecified injury of unspecified wrist, hand and finger(s), initial encounter: Secondary | ICD-10-CM | POA: Insufficient documentation

## 2013-09-30 DIAGNOSIS — Y9389 Activity, other specified: Secondary | ICD-10-CM | POA: Insufficient documentation

## 2013-09-30 DIAGNOSIS — IMO0002 Reserved for concepts with insufficient information to code with codable children: Secondary | ICD-10-CM | POA: Insufficient documentation

## 2013-09-30 DIAGNOSIS — Z79899 Other long term (current) drug therapy: Secondary | ICD-10-CM | POA: Insufficient documentation

## 2013-09-30 DIAGNOSIS — J45909 Unspecified asthma, uncomplicated: Secondary | ICD-10-CM | POA: Insufficient documentation

## 2013-09-30 DIAGNOSIS — Y9289 Other specified places as the place of occurrence of the external cause: Secondary | ICD-10-CM | POA: Insufficient documentation

## 2013-09-30 DIAGNOSIS — L089 Local infection of the skin and subcutaneous tissue, unspecified: Secondary | ICD-10-CM

## 2013-09-30 MED ORDER — TRAMADOL HCL 50 MG PO TABS
50.0000 mg | ORAL_TABLET | Freq: Once | ORAL | Status: AC
  Administered 2013-09-30: 50 mg via ORAL
  Filled 2013-09-30: qty 1

## 2013-09-30 MED ORDER — HYDROCODONE-ACETAMINOPHEN 5-325 MG PO TABS: 1.0000 | ORAL_TABLET | ORAL | Status: DC | PRN

## 2013-09-30 MED ORDER — CEPHALEXIN 250 MG PO CAPS
500.0000 mg | ORAL_CAPSULE | Freq: Once | ORAL | Status: AC
  Administered 2013-09-30: 500 mg via ORAL
  Filled 2013-09-30: qty 2

## 2013-09-30 MED ORDER — CEPHALEXIN 500 MG PO CAPS: 500.0000 mg | ORAL_CAPSULE | Freq: Four times a day (QID) | ORAL | Status: DC

## 2013-09-30 NOTE — ED Provider Notes (Signed)
CSN: [id number]     Arrival date & time 09/30/13  1734 History  This chart was scribed for a non-physician practitioner, [name], PA-C working with [name], MD by [place] Peace, ED Scribe. The patient was seen in [facility]. The patient's care was started at 6:08 PM.    Chief Complaint  Patient presents with  . Hand Injury      Patient is a 43 y.o. female presenting with hand injury. The history is provided by the patient. No language interpreter was used.  Hand Injury Location:  Hand Time since incident:  2 weeks Injury: yes   Hand location:  R hand Pain details:    Radiates to:  R shoulder   Severity:  Severe   Timing:  Constant Dislocation: no   Foreign body present:  Glass Tetanus status:  Unknown Associated symptoms: no back pain, no decreased range of motion, no fever, no muscle weakness, no neck pain and no numbness    HPI Comments: Tricia Hood is a 43 y.o. female who presents to the Emergency Department complaining of right hand pain onset 2 weeks ago with associated swelling that occurred after pt punched/hit a glass vase due to being angry over something that she did not specificy. . Pt does have small closed wound to palmar aspect of right hand which she states she did clean out effectively believes it could possibly be infected. Pt expresses her concern for possibly having pieces of glass still inside the affected area. She goes on to state that she works at [employer] and has to use her hands frequently.    Past Medical History  Diagnosis Date  . Asthma    Past Surgical History  Procedure Laterality Date  . Tubal ligation    . Ankle fracture surgery    . Tonsillectomy     Family History  Problem Relation Age of Onset  . Hypertension Mother    History  Substance Use Topics  . Smoking status: Current Every Day Smoker -- 1.00 packs/day    Types: Cigarettes  . Smokeless tobacco: Never Used  . Alcohol Use: No   OB History   Grav Para  Term Preterm Abortions TAB SAB Ect Mult Living                 Review of Systems  Constitutional: Negative for fever and chills.  Gastrointestinal: Negative for nausea, vomiting and diarrhea.  Musculoskeletal: Negative for back pain and neck pain.       Right hand pain with associated swelling.   Skin: Positive for wound (closed (potential infection)).  All other systems reviewed and are negative.     Allergies  Nsaids  Home Medications   Prior to Admission medications   Medication Sig Start Date End Date Taking? Authorizing Provider  albuterol (PROVENTIL HFA;VENTOLIN HFA) 108 (90 BASE) MCG/ACT inhaler Inhale 2 puffs into the lungs every 6 (six) hours as needed for wheezing.    Historical Provider, MD  albuterol (PROVENTIL HFA;VENTOLIN HFA) 108 (90 BASE) MCG/ACT inhaler Inhale 1-2 puffs into the lungs every 6 (six) hours as needed for wheezing. 10/23/12   Santiago Glad, PA-C  azithromycin (ZITHROMAX) 250 MG tablet Take 1 tablet (250 mg total) by mouth daily. Take first 2 tablets together, then 1 every day until finished. 10/23/12   Heather Laisure, PA-C  hydrOXYzine (ATARAX/VISTARIL) 25 MG tablet Take 1 tablet (25 mg total) by mouth at bedtime and may repeat dose one time if needed. 03/09/13   Geroge Baseman  Marte, PA-C  OVER THE COUNTER MEDICATION Take 1 tablet by mouth daily as needed (alleregies).    Historical Provider, MD  predniSONE (DELTASONE) 20 MG tablet Take 3 tabs po daily x 3 days, 2 tabs po daily x 3 days, 1 tab po daily x 3 days then stop 03/08/13   Tobey Grim, MD   BP 140/92  Pulse 81  Temp(Src) 98 F (36.7 C) (Oral)  Resp 16  SpO2 99%  LMP 08/28/2013 Physical Exam  Nursing note and vitals reviewed. Constitutional: She is oriented to person, place, and time. She appears well-developed and well-nourished. No distress.  HENT:  Head: Normocephalic and atraumatic.  Eyes: Conjunctivae and EOM are normal.  Neck: Neck supple. No tracheal deviation present.   Cardiovascular: Normal rate.   Pulmonary/Chest: Effort normal. No respiratory distress.  Musculoskeletal: Normal range of motion. She exhibits tenderness.  No obvious deformity of right hand. Full ROM of fingers.   Neurological: She is alert and oriented to person, place, and time.  Skin: Skin is warm and dry. There is erythema.  2 x 2 area of erythema and tenderness to palpation with overlying callus at base of 5th MCP of volar aspect of right hand. No open wounds.   Psychiatric: She has a normal mood and affect. Her behavior is normal.    ED Course  Procedures (including critical care time) Labs Review Labs Reviewed - No data to display  Results for orders placed during the hospital encounter of 08/13/12  CBC WITH DIFFERENTIAL      Result Value Ref Range   WBC 8.1  4.0 - 10.5 K/uL   RBC 3.97  3.87 - 5.11 MIL/uL   Hemoglobin 12.3  12.0 - 15.0 g/dL   HCT 81.1  91.4 - 78.2 %   MCV 90.9  78.0 - 100.0 fL   MCH 31.0  26.0 - 34.0 pg   MCHC 34.1  30.0 - 36.0 g/dL   RDW 95.6  21.3 - 08.6 %   Platelets 275  150 - 400 K/uL   Neutrophils Relative % 48  43 - 77 %   Neutro Abs 3.9  1.7 - 7.7 K/uL   Lymphocytes Relative 40  12 - 46 %   Lymphs Abs 3.3  0.7 - 4.0 K/uL   Monocytes Relative 6  3 - 12 %   Monocytes Absolute 0.5  0.1 - 1.0 K/uL   Eosinophils Relative 5  0 - 5 %   Eosinophils Absolute 0.4  0.0 - 0.7 K/uL   Basophils Relative 0  0 - 1 %   Basophils Absolute 0.0  0.0 - 0.1 K/uL  BASIC METABOLIC PANEL      Result Value Ref Range   Sodium 139  135 - 145 mEq/L   Potassium 4.0  3.5 - 5.1 mEq/L   Chloride 104  96 - 112 mEq/L   CO2 26  19 - 32 mEq/L   Glucose, Bld 147 (*) 70 - 99 mg/dL   BUN 8  6 - 23 mg/dL   Creatinine, Ser 5.78  0.50 - 1.10 mg/dL   Calcium 9.5  8.4 - 46.9 mg/dL   GFR calc non Af Amer >90  >90 mL/min   GFR calc Af Amer >90  >90 mL/min  TROPONIN I      Result Value Ref Range   Troponin I <0.30  <0.30 ng/mL   No results found.    Imaging Review No  results found.   EKG Interpretation None  Medications - No data to display  6:13 PM- Treatment plan was discussed with patient who verbalizes understanding and agrees.   MDM   Final diagnoses:  Skin infection    9:08 PM Patient appears to have a localized infection where she was cut by glass 2 weeks ago. I doubt any retained glass at this time. Patient refuses xray which may or may not show any possible retained glass. No tendon or ligament injury. Patient will have keflex and vicodin. Patient instructed to return with worsening or concerning symptoms.   I personally performed the services described in this documentation, which was scribed in my presence. The recorded information has been reviewed and is accurate.    Emilia Beck, PA-C 09/30/13 2110

## 2013-09-30 NOTE — ED Notes (Signed)
Kaitlyn PA at bedside  

## 2013-09-30 NOTE — ED Notes (Signed)
Pt c/o right hand pain that radiates to right shoulder.  Pt punched/hit glass vase with right hand approximately two weeks ago.  Pt states she felt like she got all the glass out of the affected area of the right hand but hand is painful with slight swelling on right hand.

## 2013-09-30 NOTE — ED Provider Notes (Signed)
Medical screening examination/treatment/procedure(s) were performed by non-physician practitioner and as supervising physician I was immediately available for consultation/collaboration.   EKG Interpretation None        Shellby Schlink, MD 09/30/13 2327 

## 2013-09-30 NOTE — ED Notes (Signed)
Declined W/C at D/C and was escorted to lobby by RN. 

## 2013-09-30 NOTE — Discharge Instructions (Signed)
Take Keflex as directed until gone. These are your antibiotics. Take vicodin as needed for pain. Return to the ED with worsening or concerning symptoms.

## 2014-10-07 ENCOUNTER — Encounter (HOSPITAL_COMMUNITY): Payer: Self-pay | Admitting: Emergency Medicine

## 2014-10-07 ENCOUNTER — Emergency Department (HOSPITAL_COMMUNITY): Payer: Self-pay

## 2014-10-07 ENCOUNTER — Encounter (HOSPITAL_COMMUNITY): Payer: Self-pay | Admitting: *Deleted

## 2014-10-07 ENCOUNTER — Inpatient Hospital Stay (HOSPITAL_COMMUNITY)
Admission: EM | Admit: 2014-10-07 | Discharge: 2014-10-10 | DRG: 871 | Disposition: A | Discharge status: Home / Self Care | Payer: Self-pay | Attending: Family Medicine | Admitting: Family Medicine

## 2014-10-07 ENCOUNTER — Emergency Department (HOSPITAL_COMMUNITY)
Admission: EM | Admit: 2014-10-07 | Discharge: 2014-10-07 | Discharge status: Against Medical Advice | Payer: Medicaid Other | Attending: Emergency Medicine | Admitting: Emergency Medicine

## 2014-10-07 DIAGNOSIS — T63301A Toxic effect of unspecified spider venom, accidental (unintentional), initial encounter: Secondary | ICD-10-CM | POA: Diagnosis present

## 2014-10-07 DIAGNOSIS — R51 Headache: Secondary | ICD-10-CM | POA: Insufficient documentation

## 2014-10-07 DIAGNOSIS — R1032 Left lower quadrant pain: Secondary | ICD-10-CM

## 2014-10-07 DIAGNOSIS — R509 Fever, unspecified: Secondary | ICD-10-CM | POA: Insufficient documentation

## 2014-10-07 DIAGNOSIS — A419 Sepsis, unspecified organism: Principal | ICD-10-CM | POA: Diagnosis present

## 2014-10-07 DIAGNOSIS — J45909 Unspecified asthma, uncomplicated: Secondary | ICD-10-CM | POA: Insufficient documentation

## 2014-10-07 DIAGNOSIS — Z8249 Family history of ischemic heart disease and other diseases of the circulatory system: Secondary | ICD-10-CM

## 2014-10-07 DIAGNOSIS — K859 Acute pancreatitis without necrosis or infection, unspecified: Secondary | ICD-10-CM | POA: Diagnosis present

## 2014-10-07 DIAGNOSIS — F17210 Nicotine dependence, cigarettes, uncomplicated: Secondary | ICD-10-CM | POA: Diagnosis present

## 2014-10-07 DIAGNOSIS — M7989 Other specified soft tissue disorders: Secondary | ICD-10-CM | POA: Diagnosis present

## 2014-10-07 DIAGNOSIS — Z720 Tobacco use: Secondary | ICD-10-CM | POA: Insufficient documentation

## 2014-10-07 DIAGNOSIS — R200 Anesthesia of skin: Secondary | ICD-10-CM | POA: Diagnosis present

## 2014-10-07 DIAGNOSIS — R109 Unspecified abdominal pain: Secondary | ICD-10-CM | POA: Diagnosis present

## 2014-10-07 DIAGNOSIS — L03116 Cellulitis of left lower limb: Secondary | ICD-10-CM | POA: Diagnosis present

## 2014-10-07 DIAGNOSIS — E876 Hypokalemia: Secondary | ICD-10-CM | POA: Diagnosis present

## 2014-10-07 DIAGNOSIS — L02416 Cutaneous abscess of left lower limb: Secondary | ICD-10-CM | POA: Insufficient documentation

## 2014-10-07 DIAGNOSIS — W57XXXA Bitten or stung by nonvenomous insect and other nonvenomous arthropods, initial encounter: Secondary | ICD-10-CM

## 2014-10-07 DIAGNOSIS — L0390 Cellulitis, unspecified: Secondary | ICD-10-CM | POA: Diagnosis present

## 2014-10-07 LAB — CBC WITH DIFFERENTIAL/PLATELET
Basophils Absolute: 0 10*3/uL (ref 0.0–0.1)
Basophils Relative: 0 % (ref 0–1)
EOS ABS: 0.3 10*3/uL (ref 0.0–0.7)
Eosinophils Relative: 3 % (ref 0–5)
HEMATOCRIT: 38.5 % (ref 36.0–46.0)
HEMOGLOBIN: 13 g/dL (ref 12.0–15.0)
LYMPHS ABS: 3.7 10*3/uL (ref 0.7–4.0)
Lymphocytes Relative: 29 % (ref 12–46)
MCH: 31.9 pg (ref 26.0–34.0)
MCHC: 33.8 g/dL (ref 30.0–36.0)
MCV: 94.4 fL (ref 78.0–100.0)
MONOS PCT: 6 % (ref 3–12)
Monocytes Absolute: 0.7 10*3/uL (ref 0.1–1.0)
NEUTROS ABS: 7.8 10*3/uL — AB (ref 1.7–7.7)
NEUTROS PCT: 62 % (ref 43–77)
Platelets: 329 10*3/uL (ref 150–400)
RBC: 4.08 MIL/uL (ref 3.87–5.11)
RDW: 13.5 % (ref 11.5–15.5)
WBC: 12.5 10*3/uL — AB (ref 4.0–10.5)

## 2014-10-07 LAB — BASIC METABOLIC PANEL
ANION GAP: 8 (ref 5–15)
BUN: 11 mg/dL (ref 6–20)
CHLORIDE: 102 mmol/L (ref 101–111)
CO2: 29 mmol/L (ref 22–32)
CREATININE: 0.77 mg/dL (ref 0.44–1.00)
Calcium: 9.3 mg/dL (ref 8.9–10.3)
GFR calc non Af Amer: >60 mL/min (ref >60)
Glucose, Bld: 108 mg/dL — ABNORMAL HIGH (ref 65–99)
POTASSIUM: 3.4 mmol/L — AB (ref 3.5–5.1)
SODIUM: 139 mmol/L (ref 135–145)

## 2014-10-07 LAB — APTT: APTT: 29 s (ref 24–37)

## 2014-10-07 LAB — LIPASE, BLOOD: Lipase: 179 U/L — ABNORMAL HIGH (ref 22–51)

## 2014-10-07 LAB — PROTIME-INR
INR: 0.9 (ref 0.00–1.49)
Prothrombin Time: 12.4 seconds (ref 11.6–15.2)

## 2014-10-07 LAB — HCG, QUANTITATIVE, PREGNANCY: hCG, Beta Chain, Quant, S: <1 m[IU]/mL (ref <5)

## 2014-10-07 MED ORDER — SODIUM CHLORIDE 0.9 % IV BOLUS (SEPSIS)
2500.0000 mL | Freq: Once | INTRAVENOUS | Status: AC
  Administered 2014-10-07: 2500 mL via INTRAVENOUS

## 2014-10-07 MED ORDER — DOXYCYCLINE HYCLATE 100 MG IV SOLR
100.0000 mg | Freq: Once | INTRAVENOUS | Status: AC
  Administered 2014-10-07: 100 mg via INTRAVENOUS
  Filled 2014-10-07: qty 100

## 2014-10-07 MED ORDER — HEPARIN SODIUM (PORCINE) 5000 UNIT/ML IJ SOLN
5000.0000 [IU] | Freq: Three times a day (TID) | INTRAMUSCULAR | Status: DC
  Administered 2014-10-07 – 2014-10-10 (×8): 5000 [IU] via SUBCUTANEOUS
  Filled 2014-10-07 (×9): qty 1

## 2014-10-07 MED ORDER — HYDROCODONE-ACETAMINOPHEN 5-325 MG PO TABS
2.0000 | ORAL_TABLET | ORAL | Status: DC | PRN
  Administered 2014-10-07 – 2014-10-10 (×14): 2 via ORAL
  Filled 2014-10-07 (×14): qty 2

## 2014-10-07 MED ORDER — ALBUTEROL SULFATE (2.5 MG/3ML) 0.083% IN NEBU
2.5000 mg | INHALATION_SOLUTION | RESPIRATORY_TRACT | Status: DC | PRN
  Administered 2014-10-08 – 2014-10-09 (×2): 2.5 mg via RESPIRATORY_TRACT
  Filled 2014-10-07 (×2): qty 3

## 2014-10-07 MED ORDER — ONDANSETRON HCL 4 MG/2ML IJ SOLN
4.0000 mg | Freq: Three times a day (TID) | INTRAMUSCULAR | Status: DC | PRN
  Administered 2014-10-07: 4 mg via INTRAVENOUS
  Filled 2014-10-07: qty 2

## 2014-10-07 MED ORDER — DOXYCYCLINE HYCLATE 100 MG IV SOLR
100.0000 mg | Freq: Two times a day (BID) | INTRAVENOUS | Status: DC
  Administered 2014-10-08: 100 mg via INTRAVENOUS
  Filled 2014-10-07: qty 100

## 2014-10-07 MED ORDER — SODIUM CHLORIDE 0.9 % IV SOLN
INTRAVENOUS | Status: DC
  Administered 2014-10-08 – 2014-10-10 (×4): via INTRAVENOUS

## 2014-10-07 MED ORDER — POTASSIUM CHLORIDE CRYS ER 20 MEQ PO TBCR
20.0000 meq | EXTENDED_RELEASE_TABLET | Freq: Once | ORAL | Status: AC
  Administered 2014-10-07: 20 meq via ORAL
  Filled 2014-10-07: qty 1

## 2014-10-07 MED ORDER — ACETAMINOPHEN 650 MG RE SUPP: 650.0000 mg | Freq: Four times a day (QID) | RECTAL | Status: DC | PRN

## 2014-10-07 MED ORDER — ACETAMINOPHEN 325 MG PO TABS
650.0000 mg | ORAL_TABLET | Freq: Four times a day (QID) | ORAL | Status: DC | PRN
  Administered 2014-10-07: 650 mg via ORAL
  Filled 2014-10-07: qty 2

## 2014-10-07 MED ORDER — OXYCODONE-ACETAMINOPHEN 5-325 MG PO TABS
1.0000 | ORAL_TABLET | Freq: Once | ORAL | Status: AC
  Administered 2014-10-07: 1 via ORAL

## 2014-10-07 MED ORDER — ONDANSETRON HCL 4 MG/2ML IJ SOLN
4.0000 mg | Freq: Once | INTRAMUSCULAR | Status: AC
  Administered 2014-10-07: 4 mg via INTRAVENOUS
  Filled 2014-10-07: qty 2

## 2014-10-07 MED ORDER — PIPERACILLIN-TAZOBACTAM 3.375 G IVPB
3.3750 g | Freq: Three times a day (TID) | INTRAVENOUS | Status: DC
  Administered 2014-10-07 – 2014-10-10 (×8): 3.375 g via INTRAVENOUS
  Filled 2014-10-07 (×8): qty 50

## 2014-10-07 MED ORDER — SODIUM CHLORIDE 0.9 % IJ SOLN
3.0000 mL | Freq: Two times a day (BID) | INTRAMUSCULAR | Status: DC
  Administered 2014-10-07 – 2014-10-08 (×2): 3 mL via INTRAVENOUS

## 2014-10-07 MED ORDER — MORPHINE SULFATE (PF) 4 MG/ML IV SOLN
4.0000 mg | Freq: Once | INTRAVENOUS | Status: AC
  Administered 2014-10-07: 4 mg via INTRAVENOUS
  Filled 2014-10-07: qty 1

## 2014-10-07 MED ORDER — MORPHINE SULFATE (PF) 2 MG/ML IV SOLN
2.0000 mg | INTRAVENOUS | Status: DC | PRN
  Administered 2014-10-07 – 2014-10-10 (×14): 2 mg via INTRAVENOUS
  Filled 2014-10-07 (×14): qty 1

## 2014-10-07 MED ORDER — OXYCODONE-ACETAMINOPHEN 5-325 MG PO TABS
ORAL_TABLET | ORAL | Status: AC
  Filled 2014-10-07: qty 1

## 2014-10-07 NOTE — ED Notes (Signed)
Bed: WA02 Expected date:  Expected time:  Means of arrival:  Comments: Triage  

## 2014-10-07 NOTE — ED Notes (Signed)
Pt reports she had a spider bite a week ago to L calf. Pt reports redness and swelling have gradually gotten worse. Pt has redness and swelling around bite site on calf to back of knee.

## 2014-10-07 NOTE — ED Provider Notes (Signed)
CSN: [id number]     Arrival date & time 10/07/14  1441 History   First MD Initiated Contact with Patient 10/07/14 1742     Chief Complaint  Patient presents with  . Insect Bite  . Cellulitis     (Consider location/radiation/quality/duration/timing/severity/associated sxs/prior Treatment) HPI Tricia Hood is a 44 y.o. female who comes in for evaluation of insect bite. Patient states on Tuesday she noticed that she had been bitten on the left side of her left calf. Since Tuesday she reports increased redness and swelling to the area "all the way up and down my leg ". She denies any fevers, reports she has been hot and cold. She reports associated numbness and tingling in her toes distal to the wound. She also "feels like my leg is going to pop". She rates her pain as severe. No other aggravating or modifying factors. Nothing seems to make the problem better. Palpation and movement of the affected area makes it worse.   Past Medical History  Diagnosis Date  . Asthma    Past Surgical History  Procedure Laterality Date  . Tubal ligation    . Ankle fracture surgery    . Tonsillectomy     Family History  Problem Relation Age of Onset  . Hypertension Mother    Social History  Substance Use Topics  . Smoking status: Current Every Day Smoker -- 1.00 packs/day    Types: Cigarettes  . Smokeless tobacco: Never Used  . Alcohol Use: No   OB History    No data available     Review of Systems A 10 point review of systems was completed and was negative except for pertinent positives and negatives as mentioned in the history of present illness     Allergies  Nsaids  Home Medications   Prior to Admission medications   Medication Sig Start Date End Date Taking? Authorizing Provider  acetaminophen (TYLENOL) 500 MG tablet Take 1,000 mg by mouth every 6 (six) hours as needed for mild pain, moderate pain or headache.   Yes Historical Provider, MD  albuterol (PROVENTIL HFA;VENTOLIN  HFA) 108 (90 BASE) MCG/ACT inhaler Inhale 1-2 puffs into the lungs every 6 (six) hours as needed for wheezing. Patient not taking: Reported on 10/07/2014 10/23/12   Santiago Glad, PA-C  HYDROcodone-acetaminophen (NORCO/VICODIN) 5-325 MG per tablet Take 1-2 tablets by mouth every 4 (four) hours as needed for moderate pain or severe pain. Patient not taking: Reported on 10/07/2014 09/30/13   Emilia Beck, PA-C  hydrOXYzine (ATARAX/VISTARIL) 25 MG tablet Take 1 tablet (25 mg total) by mouth at bedtime and may repeat dose one time if needed. Patient not taking: Reported on 10/07/2014 03/09/13   Nelva Nay, PA-C  predniSONE (DELTASONE) 20 MG tablet Take 3 tabs po daily x 3 days, 2 tabs po daily x 3 days, 1 tab po daily x 3 days then stop Patient not taking: Reported on 10/07/2014 03/08/13   Tobey Grim, MD   BP 143/79 mmHg  Pulse 94  Temp(Src) 100.4 F (38 C) (Oral)  Resp 18  SpO2 98%  LMP 10/05/2014 Physical Exam  Constitutional: She is oriented to person, place, and time. She appears well-developed and well-nourished.  HENT:  Head: Normocephalic and atraumatic.  Mouth/Throat: Oropharynx is clear and moist.  Eyes: Conjunctivae are normal. Pupils are equal, round, and reactive to light. Right eye exhibits no discharge. Left eye exhibits no discharge. No scleral icterus.  Neck: Neck supple.  Cardiovascular: Normal rate, regular rhythm  and normal heart sounds.   Pulmonary/Chest: Effort normal and breath sounds normal. No respiratory distress. She has no wheezes. She has no rales.  Abdominal: Soft. There is no tenderness.  Musculoskeletal: She exhibits no tenderness.  Neurological: She is alert and oriented to person, place, and time.  Cranial Nerves II-XII grossly intact. Motor and sensation intact and baseline for patient. Sensation intact to light touch distal to injury.  Skin: Skin is warm and dry. No rash noted.  Left calf :Localized erythematous area, approximately 4 cm in diameter,  with central ulceration/scab. Diffuse erythema around calf and distal to the wound. Left Knee appears to be larger and slightly more edematous than right knee. Distal 5 is tender to palpation without any erythema or edema.  Psychiatric: She has a normal mood and affect.  Nursing note and vitals reviewed.   ED Course  Procedures (including critical care time) Labs Review Labs Reviewed  BASIC METABOLIC PANEL - Abnormal; Notable for the following:    Potassium 3.4 (*)    Glucose, Bld 108 (*)    All other components within normal limits  CBC WITH DIFFERENTIAL/PLATELET - Abnormal; Notable for the following:    WBC 12.5 (*)    Neutro Abs 7.8 (*)    All other components within normal limits  CULTURE, BLOOD (ROUTINE X 2)  CULTURE, BLOOD (ROUTINE X 2)  HCG, QUANTITATIVE, PREGNANCY  LIPASE, BLOOD  LACTIC ACID, PLASMA  LACTIC ACID, PLASMA  PROCALCITONIN  PROTIME-INR  APTT  COMPREHENSIVE METABOLIC PANEL  CBC    Imaging Review Dg Tibia/fibula Left  10/07/2014   CLINICAL DATA:  Recent spider bite. Redness and swelling of the lower leg.  EXAM: LEFT TIBIA AND FIBULA - 2 VIEW  COMPARISON:  None.  FINDINGS: There is no evidence of fracture or other focal bone lesions. Diffuse soft tissue swelling. No radiopaque foreign body.  IMPRESSION: Diffuse soft tissue swelling.  No osseous finding.   Electronically Signed   By: Elsie Stain M.D.   On: 10/07/2014 19:20   Dg Knee Complete 4 Views Left  10/07/2014   CLINICAL DATA:  Spider bite 1 week ago.  Soft tissue swelling.  EXAM: LEFT KNEE - COMPLETE 4+ VIEW  COMPARISON:  None.  FINDINGS: There is no evidence of fracture, dislocation, or joint effusion. There is no evidence of arthropathy or other focal bone abnormality. Diffuse soft tissue swelling in the calf.  IMPRESSION: No acute osseous findings are evident.   Electronically Signed   By: Elsie Stain M.D.   On: 10/07/2014 19:21   I have personally reviewed and evaluated these images and lab results as  part of my medical decision-making.   EKG Interpretation None     Meds given in ED:  Medications  doxycycline (VIBRAMYCIN) 100 mg in dextrose 5 % 250 mL IVPB (100 mg Intravenous New Bag/Given 10/07/14 2221)  sodium chloride 0.9 % bolus 2,500 mL (not administered)  0.9 %  sodium chloride infusion (not administered)  doxycycline (VIBRAMYCIN) 100 mg in dextrose 5 % 250 mL IVPB (not administered)  morphine 2 MG/ML injection 2 mg (not administered)  ondansetron (ZOFRAN) injection 4 mg (not administered)  HYDROcodone-acetaminophen (NORCO/VICODIN) 5-325 MG per tablet 2 tablet (not administered)  piperacillin-tazobactam (ZOSYN) IVPB 3.375 g (not administered)  heparin injection 5,000 Units (not administered)  sodium chloride 0.9 % injection 3 mL (not administered)  acetaminophen (TYLENOL) tablet 650 mg (not administered)    Or  acetaminophen (TYLENOL) suppository 650 mg (not administered)  albuterol (PROVENTIL) (2.5  MG/3ML) 0.083% nebulizer solution 2.5 mg (not administered)  potassium chloride SA (K-DUR,KLOR-CON) CR tablet 20 mEq (not administered)  morphine 4 MG/ML injection 4 mg (4 mg Intravenous Given 10/07/14 1838)  morphine 4 MG/ML injection 4 mg (4 mg Intravenous Given 10/07/14 2116)  ondansetron (ZOFRAN) injection 4 mg (4 mg Intravenous Given 10/07/14 2116)    New Prescriptions   No medications on file   Filed Vitals:   10/07/14 1750 10/07/14 1929 10/07/14 2110 10/07/14 2312  BP: 160/114 132/87 130/78 143/79  Pulse: 100 93 101 94  Temp: 99.3 F (37.4 C)  100.4 F (38 C)   TempSrc: Oral  Oral   Resp: 20 19 18 18   SpO2: 100% 99% 98% 98%    MDM  Patient here for evaluation of possible insect bite to lateral aspect of left calf. Patient does exhibit paresthesias, pain out of proportion to exam and does appear to have a tight compartment. Will obtain plain films with subsequent consult to orthopedics for further concern of compartment syndrome. Spoke with orthopedic surgery, Dr.  Ranell Patrick saw patient in the ED. Low suspicion for compartment syndrome at this time, however patient does have cellulitis that'll require IV antibiotics. Patient initiated on doxycycline in the ED. Recommends admission to medical service for continued IV antibiotics. Discussed with hospitalist, Dr. Clyde Lundborg. Requests telemetry bed. Patient admitted. Prior to patient admission, I discussed and reviewed this case with my attending, Dr. Clarene Duke who also saw and evaluated the patient and agrees with above plan. Final diagnoses:  Cellulitis of left lower extremity        Joycie Peek, PA-C 10/07/14 2325  Laurence Spates, MD 10/08/14 [phone]

## 2014-10-07 NOTE — Consult Note (Signed)
Reason for Consult:left leg cellulitis rule out compartment syndrome Referring Physician: EDP  Tricia Hood is an 44 y.o. female.  HPI: 44 yo female who reports a one week history of increasing pain and swelling around an insect bite or spider bite to the proximal leg.  She is unaware of what bit her, but has had progression of her pain to the point where she wanted to be seen in the ED. She also reports tingling in the foot.  Past Medical History  Diagnosis Date  . Asthma     Past Surgical History  Procedure Laterality Date  . Tubal ligation    . Ankle fracture surgery    . Tonsillectomy      Family History  Problem Relation Age of Onset  . Hypertension Mother     Social History:  reports that she has been smoking Cigarettes.  She has been smoking about 1.00 pack per day. She has never used smokeless tobacco. She reports that she does not drink alcohol or use illicit drugs.  Allergies:  Allergies  Allergen Reactions  . Nsaids Anaphylaxis    Medications: I have reviewed the patient's current medications.  Results for orders placed or performed during the hospital encounter of 10/07/14 (from the past 48 hour(s))  Basic metabolic panel     Status: Abnormal   Collection Time: 10/07/14  6:42 PM  Result Value Ref Range   Sodium 139 135 - 145 mmol/L   Potassium 3.4 (L) 3.5 - 5.1 mmol/L   Chloride 102 101 - 111 mmol/L   CO2 29 22 - 32 mmol/L   Glucose, Bld 108 (H) 65 - 99 mg/dL   BUN 11 6 - 20 mg/dL   Creatinine, Ser 0.77 0.44 - 1.00 mg/dL   Calcium 9.3 8.9 - 10.3 mg/dL   GFR calc non Af Amer >60 >60 mL/min   GFR calc Af Amer >60 >60 mL/min    Comment: (NOTE) The eGFR has been calculated using the CKD EPI equation. This calculation has not been validated in all clinical situations. eGFR's persistently <60 mL/min signify possible Chronic Kidney Disease.    Anion gap 8 5 - 15  CBC with Differential     Status: Abnormal   Collection Time: 10/07/14  6:42 PM  Result  Value Ref Range   WBC 12.5 (H) 4.0 - 10.5 K/uL   RBC 4.08 3.87 - 5.11 MIL/uL   Hemoglobin 13.0 12.0 - 15.0 g/dL   HCT 38.5 36.0 - 46.0 %   MCV 94.4 78.0 - 100.0 fL   MCH 31.9 26.0 - 34.0 pg   MCHC 33.8 30.0 - 36.0 g/dL   RDW 13.5 11.5 - 15.5 %   Platelets 329 150 - 400 K/uL   Neutrophils Relative % 62 43 - 77 %   Neutro Abs 7.8 (H) 1.7 - 7.7 K/uL   Lymphocytes Relative 29 12 - 46 %   Lymphs Abs 3.7 0.7 - 4.0 K/uL   Monocytes Relative 6 3 - 12 %   Monocytes Absolute 0.7 0.1 - 1.0 K/uL   Eosinophils Relative 3 0 - 5 %   Eosinophils Absolute 0.3 0.0 - 0.7 K/uL   Basophils Relative 0 0 - 1 %   Basophils Absolute 0.0 0.0 - 0.1 K/uL    Dg Tibia/fibula Left  10/07/2014   CLINICAL DATA:  Recent spider bite. Redness and swelling of the lower leg.  EXAM: LEFT TIBIA AND FIBULA - 2 VIEW  COMPARISON:  None.  FINDINGS: There is  no evidence of fracture or other focal bone lesions. Diffuse soft tissue swelling. No radiopaque foreign body.  IMPRESSION: Diffuse soft tissue swelling.  No osseous finding.   Electronically Signed   By: Staci Righter M.D.   On: 10/07/2014 19:20   Dg Knee Complete 4 Views Left  10/07/2014   CLINICAL DATA:  Spider bite 1 week ago.  Soft tissue swelling.  EXAM: LEFT KNEE - COMPLETE 4+ VIEW  COMPARISON:  None.  FINDINGS: There is no evidence of fracture, dislocation, or joint effusion. There is no evidence of arthropathy or other focal bone abnormality. Diffuse soft tissue swelling in the calf.  IMPRESSION: No acute osseous findings are evident.   Electronically Signed   By: Staci Righter M.D.   On: 10/07/2014 19:21    ROS Blood pressure 130/78, pulse 101, temperature 100.4 F (38 C), temperature source Oral, resp. rate 18, last menstrual period 10/05/2014, SpO2 98 %. Physical Exam Left leg with a small 1-2 mm apparent bite or wound to the proximal leg lateral to the patellar tendon and tibial tubercle.  There is erythema surrounding the area, but no fluctuance and no drainage.   She is very tender all around the bite and the entire leg.  The skin does feel indurated but I detect no rigid or tense compartment especially distally along both the anterior and lateral compartments.  The posterior compartment is supple and completely nontender.  There is no pain with passive stretch through the compartments and the foot is well perfused with normal pulses.  Assessment/Plan: Left leg cellulitis.  No sign of compartment syndrome at this time.  Recommend medical admission for appropriate IV antibiotics and elevation and rest for the leg.  Will have the ortho tech place a short leg splint on the patient to rest the soft tissues.  This can be removed to allow for wound inspection and assessment of the compartments. Pain control is needed as well.  Would keep her at bed rest with the leg elevated, and NWB on the left LE with PT.  Tricia Hood,STEVEN R 10/07/2014, 9:29 PM

## 2014-10-07 NOTE — ED Notes (Signed)
Pt ambulating outside with female visitor.

## 2014-10-07 NOTE — ED Notes (Signed)
Pt reports possible bug bite to left lower leg last week. Now having redness, swelling and pain to entire left leg. Also having headaches and fever/chills.

## 2014-10-07 NOTE — H&P (Signed)
[facility] History and Physical  Tricia Hood WJX:[id number] DOB: 1970/08/10 DOA: 10/07/2014  Referring physician: ED physician PCP: Kathreen Cosier, MD  Specialists:   Chief Complaint: left leg swelling, red and pain, insect bite, mild abdominal pain.  HPI: Tricia Hood is a 44 y.o. female with PMH of asthma, who present with left leg swelling, redness and pain, insect bite and mild abdominal pain.  Patient states on Tuesday she noticed that she had been bitten on the lateral side of her left leg. Since Tuesday she reports increased redness and swelling to the area "all the way up and down my leg ". She reports associated numbness and tingling in her toes distal to the wound. She denies any fevers, but reports she has been hot and cold. She reports that the pain is severe, 10 out of 10 in severity, constant. She also reports mild abdominal pain over left lower quadrant and mild nausea, but no vomiting or diarrhea. He reports that her abdominal pain started after she was biten.  In ED, patient was found to have WBC 12.5, lipase 1.2, temperature 100.4, tachycardia, potassium 3.4, renal functioning okay. Patient is admitted to inpatient for further evaluation and treatment. Orthopedic surgeon was consulted by ED.  Where does patient live?   At home   Can patient participate in ADLs?  Yes     Review of Systems:   General: no fevers, chills, no changes in body weight, has fatigue HEENT: no blurry vision, hearing changes or sore throat Pulm: no dyspnea, coughing, wheezing CV: no chest pain, palpitations Abd: has nausea, no vomiting, has abdominal pain, no diarrhea, constipation GU: no dysuria, burning on urination, increased urinary frequency, hematuria  Ext: has left leg swelling and pain. Neuro: no unilateral weakness, no vision change or hearing loss Skin: no rash MSK: No muscle spasm, no deformity, no limitation of range of movement in spin Heme: No easy bruising.   Travel history: No recent long distant travel.  Allergy:  Allergies  Allergen Reactions  . Nsaids Anaphylaxis    Past Medical History  Diagnosis Date  . Asthma     Past Surgical History  Procedure Laterality Date  . Tubal ligation    . Ankle fracture surgery    . Tonsillectomy      Social History:  reports that she has been smoking Cigarettes.  She has been smoking about 1.00 pack per day. She has never used smokeless tobacco. She reports that she does not drink alcohol or use illicit drugs.  Family History:  Family History  Problem Relation Age of Onset  . Hypertension Mother      Prior to Admission medications   Medication Sig Start Date End Date Taking? Authorizing Provider  acetaminophen (TYLENOL) 500 MG tablet Take 1,000 mg by mouth every 6 (six) hours as needed for mild pain, moderate pain or headache.   Yes Historical Provider, MD  albuterol (PROVENTIL HFA;VENTOLIN HFA) 108 (90 BASE) MCG/ACT inhaler Inhale 1-2 puffs into the lungs every 6 (six) hours as needed for wheezing. Patient not taking: Reported on 10/07/2014 10/23/12   Santiago Glad, PA-C  HYDROcodone-acetaminophen (NORCO/VICODIN) 5-325 MG per tablet Take 1-2 tablets by mouth every 4 (four) hours as needed for moderate pain or severe pain. Patient not taking: Reported on 10/07/2014 09/30/13   Emilia Beck, PA-C  hydrOXYzine (ATARAX/VISTARIL) 25 MG tablet Take 1 tablet (25 mg total) by mouth at bedtime and may repeat dose one time if needed. Patient not taking: Reported on 10/07/2014  03/09/13   Nelva Nay, PA-C  predniSONE (DELTASONE) 20 MG tablet Take 3 tabs po daily x 3 days, 2 tabs po daily x 3 days, 1 tab po daily x 3 days then stop Patient not taking: Reported on 10/07/2014 03/08/13   Tobey Grim, MD    Physical Exam: Filed Vitals:   10/07/14 1929 10/07/14 2110 10/07/14 2312 10/08/14 0028  BP: 132/87 130/78 143/79 113/74  Pulse: 93 101 94 92  Temp:  100.4 F (38 C)  98.6 F (37 C)  TempSrc:   Oral  Oral  Resp: 19 18 18 20   Height:    5\' 5"  (1.651 m)  Weight:    100.5 kg (221 lb 9 oz)  SpO2: 99% 98% 98% 100%   General: Not in acute distress HEENT:       Eyes: PERRL, EOMI, no scleral icterus.       ENT: No discharge from the ears and nose, no pharynx injection, no tonsillar enlargement.        Neck: No JVD, no bruit, no mass felt. Heme: No neck lymph node enlargement. Cardiac: S1/S2, RRR, tachycardia, No murmurs, No gallops or rubs. Pulm:  No rales, wheezing, rhonchi or rubs. Abd: Soft, nondistended, mild tenderness over LLQ, no rebound pain, no organomegaly, BS present. Ext: 2+DP/PT pulse bilaterally. Patient has splint placed to the left leg by ortho when I saw pt in ED. Per EDP's note, "Left calf : Localized erythematous area, approximately 4 cm in diameter, with central ulceration/scab. Diffuse erythema around calf and distal to the wound. Left Knee appears to be larger and slightly more edematous than right knee. Distal 5 is tender to palpation without any erythema or edema" Musculoskeletal: No joint deformities, No joint redness or warmth, no limitation of ROM in spin. Skin: No rashes. There is a insect bite mark over lateral left lower leg just below the knee joint. Neuro: Alert, oriented X3, cranial nerves II-XII grossly intact, muscle strength 5/5 in all extremities, sensation to light touch intact.  Psych: Patient is not psychotic, no suicidal or hemocidal ideation.  Labs on Admission:  Basic Metabolic Panel:  Recent Labs Lab 10/07/14 1842  NA 139  K 3.4*  CL 102  CO2 29  GLUCOSE 108*  BUN 11  CREATININE 0.77  CALCIUM 9.3   Liver Function Tests: No results for input(s): AST, ALT, ALKPHOS, BILITOT, PROT, ALBUMIN in the last 168 hours.  Recent Labs Lab 10/07/14 2252  LIPASE 179*   No results for input(s): AMMONIA in the last 168 hours. CBC:  Recent Labs Lab 10/07/14 1842  WBC 12.5*  NEUTROABS 7.8*  HGB 13.0  HCT 38.5  MCV 94.4  PLT 329    Cardiac Enzymes: No results for input(s): CKTOTAL, CKMB, CKMBINDEX, TROPONINI in the last 168 hours.  BNP (last 3 results) No results for input(s): BNP in the last 8760 hours.  ProBNP (last 3 results) No results for input(s): PROBNP in the last 8760 hours.  CBG: No results for input(s): GLUCAP in the last 168 hours.  Radiological Exams on Admission: Dg Tibia/fibula Left  10/07/2014   CLINICAL DATA:  Recent spider bite. Redness and swelling of the lower leg.  EXAM: LEFT TIBIA AND FIBULA - 2 VIEW  COMPARISON:  None.  FINDINGS: There is no evidence of fracture or other focal bone lesions. Diffuse soft tissue swelling. No radiopaque foreign body.  IMPRESSION: Diffuse soft tissue swelling.  No osseous finding.   Electronically Signed   By: Jackquline Denmark  Curnes M.D.   On: 10/07/2014 19:20   Dg Knee Complete 4 Views Left  10/07/2014   CLINICAL DATA:  Spider bite 1 week ago.  Soft tissue swelling.  EXAM: LEFT KNEE - COMPLETE 4+ VIEW  COMPARISON:  None.  FINDINGS: There is no evidence of fracture, dislocation, or joint effusion. There is no evidence of arthropathy or other focal bone abnormality. Diffuse soft tissue swelling in the calf.  IMPRESSION: No acute osseous findings are evident.   Electronically Signed   By: Elsie Stain M.D.   On: 10/07/2014 19:21    EKG: Not done in ED, will get one.   Assessment/Plan Principal Problem:   Cellulitis Active Problems:   Insect bite   Asthma   Abdominal pain   Hypokalemia   Pancreatitis  Cellulitis and sepsis: Patient has a left leg cellulitis, clinically septic with the leukocytosis, fever and tachycardia. She is hemodynamically stable. Orthopedic surgery was consulted. Dr. Ranell Patrick saw pt and thought no sign of compartment syndrome at this time.   - will admit to tele bed - Appreciate Dr. Charlynn Grimes consultation, with follow-up recommendations. - Ed started doxycycline, will add Zosyn - Blood cultures x 2  - will get Procalcitonin and trend lactic  acid levels per sepsis protocol. - IVF: 2.5L of NS bolus in ED, followed by 75 cc/h - Bed rest with left leg elevation - When necessary Zofran for nausea, morphine and Norco for pain - per Dr. Ranell Patrick, splint can be removed to allow for wound inspection and assessment of the compartments.  Asthma: Stable, no signs of acute exacerbation. Lung auscultation is clear bilaterally. - Albuterol when necessary  Hypokalemia: K=3.4 on admission. - Repleted  Abdominal pain and pancreatitis: Lipase, back elevated 179, indicating pancreatitis is the etiology for abdominal pain. Not sure whether this is related to insect bite, such as spider bite. -will put pt on NPO -IVF: as above -IV morphine for pain control, IV zofran for nausea -abdominal ultrasound to re-evaluate pancreas, gallbladder, and bile ducts  -check lipid profile to rule out hypertriglyceridemia. -check LFT   DVT ppx: SQ Heparin    Code Status: Full code Family Communication:  Yes, patient's friend at bed side Disposition Plan: Admit to inpatient   Date of Service 10/08/2014    Lorretta Harp [facility] Pager [phone]  If 7PM-7AM, please contact night-coverage [email or web address] Password [email or web address] 10/08/2014, 4:20 AM

## 2014-10-08 ENCOUNTER — Inpatient Hospital Stay (HOSPITAL_COMMUNITY): Payer: Medicaid Other

## 2014-10-08 ENCOUNTER — Encounter (HOSPITAL_COMMUNITY): Payer: Self-pay | Admitting: Nurse Practitioner

## 2014-10-08 DIAGNOSIS — T148 Other injury of unspecified body region: Secondary | ICD-10-CM

## 2014-10-08 DIAGNOSIS — J4520 Mild intermittent asthma, uncomplicated: Secondary | ICD-10-CM

## 2014-10-08 DIAGNOSIS — W57XXXA Bitten or stung by nonvenomous insect and other nonvenomous arthropods, initial encounter: Secondary | ICD-10-CM

## 2014-10-08 DIAGNOSIS — E876 Hypokalemia: Secondary | ICD-10-CM

## 2014-10-08 DIAGNOSIS — K859 Acute pancreatitis without necrosis or infection, unspecified: Secondary | ICD-10-CM | POA: Diagnosis present

## 2014-10-08 DIAGNOSIS — L03116 Cellulitis of left lower limb: Secondary | ICD-10-CM

## 2014-10-08 LAB — LIPID PANEL
CHOLESTEROL: 135 mg/dL (ref 0–200)
HDL: 47 mg/dL (ref >40)
LDL CALC: 59 mg/dL (ref 0–99)
TRIGLYCERIDES: 146 mg/dL (ref <150)
Total CHOL/HDL Ratio: 2.9 RATIO
VLDL: 29 mg/dL (ref 0–40)

## 2014-10-08 LAB — COMPREHENSIVE METABOLIC PANEL
ALBUMIN: 3.3 g/dL — AB (ref 3.5–5.0)
ALK PHOS: 46 U/L (ref 38–126)
ALT: 16 U/L (ref 14–54)
AST: 16 U/L (ref 15–41)
Anion gap: 4 — ABNORMAL LOW (ref 5–15)
BILIRUBIN TOTAL: 0.5 mg/dL (ref 0.3–1.2)
BUN: 10 mg/dL (ref 6–20)
CALCIUM: 8.1 mg/dL — AB (ref 8.9–10.3)
CO2: 26 mmol/L (ref 22–32)
CREATININE: 0.71 mg/dL (ref 0.44–1.00)
Chloride: 109 mmol/L (ref 101–111)
GFR calc Af Amer: >60 mL/min (ref >60)
GFR calc non Af Amer: >60 mL/min (ref >60)
GLUCOSE: 103 mg/dL — AB (ref 65–99)
Potassium: 3.9 mmol/L (ref 3.5–5.1)
Sodium: 139 mmol/L (ref 135–145)
TOTAL PROTEIN: 6.3 g/dL — AB (ref 6.5–8.1)

## 2014-10-08 LAB — HEPATIC FUNCTION PANEL
ALT: 18 U/L (ref 14–54)
AST: 17 U/L (ref 15–41)
Albumin: 3.2 g/dL — ABNORMAL LOW (ref 3.5–5.0)
Alkaline Phosphatase: 46 U/L (ref 38–126)
Total Bilirubin: 0.7 mg/dL (ref 0.3–1.2)
Total Protein: 6.4 g/dL — ABNORMAL LOW (ref 6.5–8.1)

## 2014-10-08 LAB — CBC
HEMATOCRIT: 34.7 % — AB (ref 36.0–46.0)
Hemoglobin: 11.5 g/dL — ABNORMAL LOW (ref 12.0–15.0)
MCH: 31.7 pg (ref 26.0–34.0)
MCHC: 33.1 g/dL (ref 30.0–36.0)
MCV: 95.6 fL (ref 78.0–100.0)
Platelets: 296 10*3/uL (ref 150–400)
RBC: 3.63 MIL/uL — ABNORMAL LOW (ref 3.87–5.11)
RDW: 13.7 % (ref 11.5–15.5)
WBC: 10.2 10*3/uL (ref 4.0–10.5)

## 2014-10-08 LAB — LACTIC ACID, PLASMA: LACTIC ACID, VENOUS: 1.2 mmol/L (ref 0.5–2.0)

## 2014-10-08 LAB — PROCALCITONIN: Procalcitonin: <0.1 ng/mL

## 2014-10-08 MED ORDER — VANCOMYCIN HCL 10 G IV SOLR
2000.0000 mg | Freq: Once | INTRAVENOUS | Status: AC
  Administered 2014-10-08: 2000 mg via INTRAVENOUS
  Filled 2014-10-08: qty 2000

## 2014-10-08 MED ORDER — NICOTINE 21 MG/24HR TD PT24
21.0000 mg | MEDICATED_PATCH | Freq: Every day | TRANSDERMAL | Status: DC
  Administered 2014-10-08 – 2014-10-10 (×3): 21 mg via TRANSDERMAL
  Filled 2014-10-08 (×4): qty 1

## 2014-10-08 MED ORDER — VANCOMYCIN HCL IN DEXTROSE 1-5 GM/200ML-% IV SOLN
1000.0000 mg | Freq: Three times a day (TID) | INTRAVENOUS | Status: DC
  Administered 2014-10-09 – 2014-10-10 (×5): 1000 mg via INTRAVENOUS
  Filled 2014-10-08 (×4): qty 200

## 2014-10-08 NOTE — Progress Notes (Signed)
Subjective: Feels a little better this AM   Objective: Vital signs in last 24 hours: Temp:  [98.3 F (36.8 C)-100.4 F (38 C)] 98.4 F (36.9 C) (09/10 0527) Pulse Rate:  [82-101] 95 (09/10 0527) Resp:  [16-20] 18 (09/10 0527) BP: (108-160)/(74-114) 108/85 mmHg (09/10 0527) SpO2:  [98 %-100 %] 100 % (09/10 0527) Weight:  [86.183 kg (190 lb)-100.5 kg (221 lb 9 oz)] 100.5 kg (221 lb 9 oz) (09/10 0028)  Intake/Output from previous day: 09/09 0701 - 09/10 0700 In: 743 [I.V.:443; IV Piggyback:300] Out: 750 [Urine:750] Intake/Output this shift:     Recent Labs  10/07/14 1842 10/08/14 0516  HGB 13.0 11.5*    Recent Labs  10/07/14 1842 10/08/14 0516  WBC 12.5* 10.2  RBC 4.08 3.63*  HCT 38.5 34.7*  PLT 329 296    Recent Labs  10/07/14 1842 10/08/14 0516  NA 139 139  K 3.4* 3.9  CL 102 109  CO2 29 26  BUN 11 10  CREATININE 0.77 0.71  GLUCOSE 108* 103*  CALCIUM 9.3 8.1*    Recent Labs  10/07/14 2252  INR 0.90    Compartment soft Mild swelling  No erythema  Assessment/Plan: LLE cellulitis- Doing better already. Continue IV antibiotics   Tricia Hood V 10/08/2014, 8:29 AM

## 2014-10-08 NOTE — Progress Notes (Signed)
ANTIBIOTIC CONSULT NOTE - INITIAL  Pharmacy Consult for Vancomycin  Indication: LLE cellulitis  Allergies  Allergen Reactions  . Nsaids Anaphylaxis    Patient Measurements: Height:  (165.1 cm) Weight: 221 lb 9 oz (100.5 kg) IBW/kg (Calculated) : 57   Vital Signs: Temp: 98.4 F (36.9 C) (09/10 0527) Temp Source: Oral (09/10 0527) BP: 108/85 mmHg (09/10 0527) Pulse Rate: 95 (09/10 0527) Intake/Output from previous day: 09/09 0701 - 09/10 0700 In: 743 [I.V.:443; IV Piggyback:300] Out: 750 [Urine:750] Intake/Output from this shift: Total I/O In: 120 [P.O.:120] Out: -   Labs:  Recent Labs  10/07/14 1842 10/08/14 0516  WBC 12.5* 10.2  HGB 13.0 11.5*  PLT 329 296  CREATININE 0.77 0.71   Estimated Creatinine Clearance: 105.4 mL/min (by C-G formula based on Cr of 0.71). No results for input(s): VANCOTROUGH, VANCOPEAK, VANCORANDOM, GENTTROUGH, GENTPEAK, GENTRANDOM, TOBRATROUGH, TOBRAPEAK, TOBRARND, AMIKACINPEAK, AMIKACINTROU, AMIKACIN in the last 72 hours.   Microbiology: No results found for this or any previous visit (from the past 720 hour(s)).  Medical History: Past Medical History  Diagnosis Date  . Asthma     Medications:  Prescriptions prior to admission  Medication Sig Dispense Refill Last Dose  . acetaminophen (TYLENOL) 500 MG tablet Take 1,000 mg by mouth every 6 (six) hours as needed for mild pain, moderate pain or headache.   10/07/2014 at Unknown time  . albuterol (PROVENTIL HFA;VENTOLIN HFA) 108 (90 BASE) MCG/ACT inhaler Inhale 1-2 puffs into the lungs every 6 (six) hours as needed for wheezing. (Patient not taking: Reported on 10/07/2014) 1 Inhaler 0 Not Taking at Unknown time  . HYDROcodone-acetaminophen (NORCO/VICODIN) 5-325 MG per tablet Take 1-2 tablets by mouth every 4 (four) hours as needed for moderate pain or severe pain. (Patient not taking: Reported on 10/07/2014) 20 tablet 0 Not Taking at Unknown time  . hydrOXYzine (ATARAX/VISTARIL) 25 MG  tablet Take 1 tablet (25 mg total) by mouth at bedtime and may repeat dose one time if needed. (Patient not taking: Reported on 10/07/2014) 30 tablet 0 Not Taking at Unknown time  . predniSONE (DELTASONE) 20 MG tablet Take 3 tabs po daily x 3 days, 2 tabs po daily x 3 days, 1 tab po daily x 3 days then stop (Patient not taking: Reported on 10/07/2014) 18 tablet 0 Not Taking at Unknown time   Scheduled:  . heparin  5,000 Units Subcutaneous 3 times per day  . nicotine  21 mg Transdermal Daily  . piperacillin-tazobactam (ZOSYN)  IV  3.375 g Intravenous 3 times per day  . sodium chloride  3 mL Intravenous Q12H  . vancomycin  2,000 mg Intravenous Once   Infusions:  . sodium chloride 75 mL/hr at 10/08/14 0108   Assessment: 44 yoF with LLE cellulitis from insect bite.  Vancomycin per Rx.  Goal of Therapy:  Vancomycin trough level 10-15 mcg/ml  Plan:   Vancomycin 2 Gm x1 then 1 Gm IV q8h  F/u SCr/cultures/levels  Susanne Greenhouse R 10/08/2014,1:33 PM

## 2014-10-08 NOTE — Progress Notes (Addendum)
[facility] PROGRESS NOTE  Tricia Hood EAV:[id number] DOB: [birth year] DOA: 10/07/2014 PCP: Kathreen Cosier, MD  Assessment/Plan: 1. Left leg cellulitis- patient was started on doxycycline and Zosyn, will change doxycycline to vancomycin per pharmacy consultation. Orthopedics following 2. Asthma- stable continue when necessary albuterol 3. Hypokalemia- repleted, repeat potassium 3.9 4. Abdominal pain- patient had mild elevation of lipase 179, denies pain this morning. Will recheck lipase. 5. DVT prophylaxis- heparin  Code Status: Full code Family Communication: *Discussed with patient Disposition Plan: Home when medically stable   Consultants:  Orthopedics  Procedures:  None  Antibiotics: Vancomycin Zosyn  HPI/Subjective: 44 y.o. female with PMH of asthma, who present with left leg swelling, redness and pain, insect bite and mild abdominal pain.Since Tuesday she reports increased redness and swelling to the area "all the way up and down my leg ". She reports associated numbness and tingling in her toes distal to the wound. She denies any fevers, but reports she has been hot and cold. She reports that the pain is severe, 10 out of 10 in severity, constant. She also reports mild abdominal pain over left lower quadrant and mild nausea, but no vomiting or diarrhea. He reports that her abdominal pain started after she was biten.  Patient found to have cellulitis, was seen by orthopedic surgeon and compartment syndrome was ruled out. This morning patient feels better  Objective: Filed Vitals:   10/08/14 0527  BP: 108/85  Pulse: 95  Temp: 98.4 F (36.9 C)  Resp: 18    Intake/Output Summary (Last 24 hours) at 10/08/14 1322 Last data filed at 10/08/14 1200  Gross per 24 hour  Intake    863 ml  Output    750 ml  Net    113 ml   Filed Weights   10/08/14 0028  Weight: 100.5 kg (221 lb 9 oz)    Exam:   General:  Appears in no acute  distress  Cardiovascular: S1-S2 regular  Respiratory: Clear to auscultation bilaterally  Abdomen: Soft, nontender, no organomegaly  Musculoskeletal: Left lower extremity in dressing   Data Reviewed: Basic Metabolic Panel:  Recent Labs Lab 10/07/14 1842 10/08/14 0516  NA 139 139  K 3.4* 3.9  CL 102 109  CO2 29 26  GLUCOSE 108* 103*  BUN 11 10  CREATININE 0.77 0.71  CALCIUM 9.3 8.1*   Liver Function Tests:  Recent Labs Lab 10/08/14 0516  AST 16  17  ALT 16  18  ALKPHOS 46  46  BILITOT 0.5  0.7  PROT 6.3*  6.4*  ALBUMIN 3.3*  3.2*    Recent Labs Lab 10/07/14 2252  LIPASE 179*   No results for input(s): AMMONIA in the last 168 hours. CBC:  Recent Labs Lab 10/07/14 1842 10/08/14 0516  WBC 12.5* 10.2  NEUTROABS 7.8*  --   HGB 13.0 11.5*  HCT 38.5 34.7*  MCV 94.4 95.6  PLT 329 296   Cardiac Enzymes: No results for input(s): CKTOTAL, CKMB, CKMBINDEX, TROPONINI in the last 168 hours. BNP (last 3 results) No results for input(s): BNP in the last 8760 hours.  Studies: Dg Tibia/fibula Left  10/07/2014   CLINICAL DATA:  Recent spider bite. Redness and swelling of the lower leg.  EXAM: LEFT TIBIA AND FIBULA - 2 VIEW  COMPARISON:  None.  FINDINGS: There is no evidence of fracture or other focal bone lesions. Diffuse soft tissue swelling. No radiopaque foreign body.  IMPRESSION: Diffuse soft tissue swelling.  No osseous finding.  Electronically Signed   By: Elsie Stain M.D.   On: 10/07/2014 19:20   US Abdomen Complete  10/08/2014   CLINICAL DATA:  Left-sided abdominal pain  EXAM: ULTRASOUND ABDOMEN COMPLETE  COMPARISON:  Abdominal CT 03/16/2011  FINDINGS: Gallbladder: No gallstones or wall thickening visualized. No sonographic Murphy sign noted.  Common bile duct: Diameter: 5 mm  Liver: No focal lesion identified. Mildly heterogeneous and echogenic, possible mild fatty infiltration. Antegrade flow in the imaged portal venous system.  IVC: No abnormality  visualized.  Pancreas: Visualized portion unremarkable.  Spleen: Size and appearance within normal limits.  Right Kidney: Length: 12 cm. Echogenicity within normal limits. No mass or hydronephrosis visualized.  Left Kidney: Length: 12 cm. Echogenicity within normal limits. No mass or hydronephrosis visualized.  Abdominal aorta: Limited visualization of the aorta with no evidence of aneurysm. No aneurysm present in 2013.  IMPRESSION: Negative abdominal ultrasound.  No explanation for pain.   Electronically Signed   By: Marnee Spring M.D.   On: 10/08/2014 11:57   Dg Knee Complete 4 Views Left  10/07/2014   CLINICAL DATA:  Spider bite 1 week ago.  Soft tissue swelling.  EXAM: LEFT KNEE - COMPLETE 4+ VIEW  COMPARISON:  None.  FINDINGS: There is no evidence of fracture, dislocation, or joint effusion. There is no evidence of arthropathy or other focal bone abnormality. Diffuse soft tissue swelling in the calf.  IMPRESSION: No acute osseous findings are evident.   Electronically Signed   By: Elsie Stain M.D.   On: 10/07/2014 19:21    Scheduled Meds: . doxycycline (VIBRAMYCIN) IV  100 mg Intravenous Q12H  . heparin  5,000 Units Subcutaneous 3 times per day  . nicotine  21 mg Transdermal Daily  . piperacillin-tazobactam (ZOSYN)  IV  3.375 g Intravenous 3 times per day  . sodium chloride  3 mL Intravenous Q12H   Continuous Infusions: . sodium chloride 75 mL/hr at 10/08/14 0108    Principal Problem:   Cellulitis Active Problems:   Insect bite   Asthma   Abdominal pain   Hypokalemia   Pancreatitis    Time spent: 25 min    [facility]  [facility] Pager [phone]. If 7PM-7AM, please contact night-coverage at [email or web address], password [facility] 10/08/2014, 1:22 PM  LOS: 1 day

## 2014-10-08 NOTE — Evaluation (Signed)
Physical Therapy Evaluation Patient Details Name: Tricia Hood MRN: [id number] DOB: 1970/06/02 Today's Date: 10/08/2014   History of Present Illness  44 yo female admitted with L LE cellutlits.   Clinical Impression  On eval, pt was supervision level for mobility-took a few hopping steps in room with RW. Educated and instructed pt in NWB technique-per Dr Ranell Patrick recommendation in orders. Mod encouragement for pt to agree to stand and take a few steps. Pt reported increased pain with OOB activity. Do not feel pt will have any follow up PT needs at discharge. Will likely need encouragement to progress activity during hospital stay. If pt does well with RW, will try crutches.     Follow Up Recommendations No PT follow up    Equipment Recommendations  Rolling walker with 5" wheels (if pt will agree to it)   Recommendations for Other Services       Precautions / Restrictions Precautions Precautions: Fall Restrictions Weight Bearing Restrictions: Yes LLE Weight Bearing: Non weight bearing Other Position/Activity Restrictions: per order set by Dr Ranell Patrick 9/9      Mobility  Bed Mobility Overal bed mobility: Modified Independent                Transfers Overall transfer level: Modified independent                  Ambulation/Gait Ambulation/Gait assistance: Supervision Ambulation Distance (Feet): 4 Feet Assistive device: Rolling walker (2 wheeled)       General Gait Details: VCs safety, adherence to NWB status. Instruced in NWB-per Dr Ranell Patrick order.   Stairs            Wheelchair Mobility    Modified Rankin (Stroke Patients Only)       Balance                                             Pertinent Vitals/Pain Pain Assessment: 0-10 Pain Location: L LE Pain Descriptors / Indicators: Throbbing Pain Intervention(s): Repositioned    Home Living Family/patient expects to be discharged to:: Private residence Living  Arrangements: Alone;Children     Home Access: Stairs to enter Entrance Stairs-Rails: Right Entrance Stairs-Number of Steps: 3 Home Layout: One level Home Equipment: None      Prior Function Level of Independence: Independent               Hand Dominance        Extremity/Trunk Assessment               Lower Extremity Assessment: LLE deficits/detail   LLE Deficits / Details: splint in place. edema noted.   Cervical / Trunk Assessment: Normal  Communication   Communication: No difficulties  Cognition Arousal/Alertness: Awake/alert Behavior During Therapy: WFL for tasks assessed/performed Overall Cognitive Status: Within Functional Limits for tasks assessed                      General Comments      Exercises        Assessment/Plan    PT Assessment Patient needs continued PT services  PT Diagnosis Difficulty walking;Abnormality of gait;Acute pain   PT Problem List Decreased mobility;Decreased activity tolerance;Decreased knowledge of use of DME;Decreased knowledge of precautions;Pain  PT Treatment Interventions DME instruction;Gait training;Stair training;Functional mobility training;Therapeutic activities;Patient/family education;Balance training;Therapeutic exercise   PT Goals (Current goals can be found in  the Care Plan section) Acute Rehab PT Goals Patient Stated Goal: less pain PT Goal Formulation: With patient Time For Goal Achievement: 10/22/14 Potential to Achieve Goals: Good    Frequency Min 3X/week   Barriers to discharge        Co-evaluation               End of Session   Activity Tolerance: Patient limited by pain Patient left: in bed;with call bell/phone within reach;with family/visitor present           Time: 6644-0347 PT Time Calculation (min) (ACUTE ONLY): 11 min   Charges:   PT Evaluation $Initial PT Evaluation Tier I: 1 Procedure     PT G Codes:        Rebeca Alert, MPT Pager: [phone]

## 2014-10-09 LAB — CBC
HCT: 34.7 % — ABNORMAL LOW (ref 36.0–46.0)
Hemoglobin: 11.3 g/dL — ABNORMAL LOW (ref 12.0–15.0)
MCH: 31.3 pg (ref 26.0–34.0)
MCHC: 32.6 g/dL (ref 30.0–36.0)
MCV: 96.1 fL (ref 78.0–100.0)
PLATELETS: 315 10*3/uL (ref 150–400)
RBC: 3.61 MIL/uL — ABNORMAL LOW (ref 3.87–5.11)
RDW: 13.6 % (ref 11.5–15.5)
WBC: 10 10*3/uL (ref 4.0–10.5)

## 2014-10-09 LAB — BASIC METABOLIC PANEL
Anion gap: 6 (ref 5–15)
BUN: 7 mg/dL (ref 6–20)
CALCIUM: 9 mg/dL (ref 8.9–10.3)
CO2: 29 mmol/L (ref 22–32)
CREATININE: 0.72 mg/dL (ref 0.44–1.00)
Chloride: 105 mmol/L (ref 101–111)
Glucose, Bld: 102 mg/dL — ABNORMAL HIGH (ref 65–99)
Potassium: 4.1 mmol/L (ref 3.5–5.1)
SODIUM: 140 mmol/L (ref 135–145)

## 2014-10-09 LAB — LIPASE, BLOOD: LIPASE: 77 U/L — AB (ref 22–51)

## 2014-10-09 MED ORDER — BISACODYL 10 MG RE SUPP
10.0000 mg | Freq: Every day | RECTAL | Status: DC | PRN
  Administered 2014-10-09: 10 mg via RECTAL
  Filled 2014-10-09: qty 1

## 2014-10-09 NOTE — Progress Notes (Signed)
   Subjective: Hospital day - 2 Patient reports pain as mild.   Patient seen in rounds by Dr. Lequita Halt. Patient is doing better and less pain  Objective: Vital signs in last 24 hours: Temp:  [98.1 F (36.7 C)-99.2 F (37.3 C)] 99.2 F (37.3 C) (09/11 0556) Pulse Rate:  [89-95] 95 (09/11 0556) Resp:  [20] 20 (09/11 0556) BP: (107-133)/(67-78) 133/78 mmHg (09/11 0556) SpO2:  [100 %] 100 % (09/11 0556)  Intake/Output from previous day:  Intake/Output Summary (Last 24 hours) at 10/09/14 0909 Last data filed at 10/09/14 0100  Gross per 24 hour  Intake   2190 ml  Output      0 ml  Net   2190 ml     Labs:  Recent Labs  10/07/14 1842 10/08/14 0516 10/09/14 0610  HGB 13.0 11.5* 11.3*    Recent Labs  10/08/14 0516 10/09/14 0610  WBC 10.2 10.0  RBC 3.63* 3.61*  HCT 34.7* 34.7*  PLT 296 315    Recent Labs  10/08/14 0516 10/09/14 0610  NA 139 140  K 3.9 4.1  CL 109 105  CO2 26 29  BUN 10 7  CREATININE 0.71 0.72  GLUCOSE 103* 102*  CALCIUM 8.1* 9.0    Recent Labs  10/07/14 2252  INR 0.90    EXAM General - Patient is Alert and Appropriate Extremity - Neurovascular intact Sensation intact distally Compartment soft Motor Function - intact, moving foot and toes well on exam.   Past Medical History  Diagnosis Date  . Asthma     Assessment/Plan: Hospital day - 2 Principal Problem:   Cellulitis Active Problems:   Insect bite   Asthma   Abdominal pain   Hypokalemia   Pancreatitis  Estimated body mass index is 36.87 kg/(m^2) as calculated from the following:   Height as of this encounter:  (1.651 m).   Weight as of this encounter: 100.5 kg (221 lb 9 oz).  LLE cellulitis doing better clinically.  No surgical indication at this time.  Avel Peace, PA-C Orthopaedic Surgery 10/09/2014, 9:09 AM

## 2014-10-09 NOTE — Progress Notes (Signed)
[facility] PROGRESS NOTE  Tricia Hood ZOX:[id number] DOB: July 16, 1970 DOA: 10/07/2014 PCP: Kathreen Cosier, MD  Assessment/Plan: 1. Left leg cellulitis- patient was started on doxycycline and Zosyn, will change doxycycline to vancomycin per pharmacy consultation. Orthopedics following 2. Asthma- stable continue when necessary albuterol 3. Hypokalemia- repleted, repeat potassium 4.1 4. Abdominal pain- patient had mild elevation of lipase 179, denies pain this morning. Repeat lipase 77 5. DVT prophylaxis- heparin  Code Status: Full code Family Communication: *Discussed with patient Disposition Plan: Home when medically stable   Consultants:  Orthopedics  Procedures:  None  Antibiotics: Vancomycin Zosyn  HPI/Subjective: 44 y.o. female with PMH of asthma, who present with left leg swelling, redness and pain, insect bite and mild abdominal pain.Since Tuesday she reports increased redness and swelling to the area "all the way up and down my leg ". She reports associated numbness and tingling in her toes distal to the wound. She denies any fevers, but reports she has been hot and cold. She reports that the pain is severe, 10 out of 10 in severity, constant. She also reports mild abdominal pain over left lower quadrant and mild nausea, but no vomiting or diarrhea. He reports that her abdominal pain started after she was biten. Patient found to have cellulitis, was seen by orthopedic surgeon and compartment syndrome was ruled out. This morning patient feeling much better. Says the pain has improved  Objective: Filed Vitals:   10/09/14 0556  BP: 133/78  Pulse: 95  Temp: 99.2 F (37.3 C)  Resp: 20    Intake/Output Summary (Last 24 hours) at 10/09/14 1431 Last data filed at 10/09/14 0900  Gross per 24 hour  Intake   2190 ml  Output      0 ml  Net   2190 ml   Filed Weights   10/08/14 0028  Weight: 100.5 kg (221 lb 9 oz)    Exam:   General:  Appears in no  acute distress  Cardiovascular: S1-S2 regular  Respiratory: Clear to auscultation bilaterally  Abdomen: Soft, nontender, no organomegaly  Musculoskeletal: Left lower extremity mild erythema noted below the knee with fluctuation and small open wound  on the lateral aspect  Data Reviewed: Basic Metabolic Panel:  Recent Labs Lab 10/07/14 1842 10/08/14 0516 10/09/14 0610  NA 139 139 140  K 3.4* 3.9 4.1  CL 102 109 105  CO2 29 26 29   GLUCOSE 108* 103* 102*  BUN 11 10 7   CREATININE 0.77 0.71 0.72  CALCIUM 9.3 8.1* 9.0   Liver Function Tests:  Recent Labs Lab 10/08/14 0516  AST 16  17  ALT 16  18  ALKPHOS 46  46  BILITOT 0.5  0.7  PROT 6.3*  6.4*  ALBUMIN 3.3*  3.2*    Recent Labs Lab 10/07/14 2252 10/09/14 0610  LIPASE 179* 77*   No results for input(s): AMMONIA in the last 168 hours. CBC:  Recent Labs Lab 10/07/14 1842 10/08/14 0516 10/09/14 0610  WBC 12.5* 10.2 10.0  NEUTROABS 7.8*  --   --   HGB 13.0 11.5* 11.3*  HCT 38.5 34.7* 34.7*  MCV 94.4 95.6 96.1  PLT 329 296 315   Cardiac Enzymes: No results for input(s): CKTOTAL, CKMB, CKMBINDEX, TROPONINI in the last 168 hours. BNP (last 3 results) No results for input(s): BNP in the last 8760 hours.  Studies: Dg Tibia/fibula Left  10/07/2014   CLINICAL DATA:  Recent spider bite. Redness and swelling of the lower leg.  EXAM: LEFT TIBIA  AND FIBULA - 2 VIEW  COMPARISON:  None.  FINDINGS: There is no evidence of fracture or other focal bone lesions. Diffuse soft tissue swelling. No radiopaque foreign body.  IMPRESSION: Diffuse soft tissue swelling.  No osseous finding.   Electronically Signed   By: Elsie Stain M.D.   On: 10/07/2014 19:20   US Abdomen Complete  10/08/2014   CLINICAL DATA:  Left-sided abdominal pain  EXAM: ULTRASOUND ABDOMEN COMPLETE  COMPARISON:  Abdominal CT 03/16/2011  FINDINGS: Gallbladder: No gallstones or wall thickening visualized. No sonographic Murphy sign noted.  Common bile  duct: Diameter: 5 mm  Liver: No focal lesion identified. Mildly heterogeneous and echogenic, possible mild fatty infiltration. Antegrade flow in the imaged portal venous system.  IVC: No abnormality visualized.  Pancreas: Visualized portion unremarkable.  Spleen: Size and appearance within normal limits.  Right Kidney: Length: 12 cm. Echogenicity within normal limits. No mass or hydronephrosis visualized.  Left Kidney: Length: 12 cm. Echogenicity within normal limits. No mass or hydronephrosis visualized.  Abdominal aorta: Limited visualization of the aorta with no evidence of aneurysm. No aneurysm present in 2013.  IMPRESSION: Negative abdominal ultrasound.  No explanation for pain.   Electronically Signed   By: Marnee Spring M.D.   On: 10/08/2014 11:57   Dg Knee Complete 4 Views Left  10/07/2014   CLINICAL DATA:  Spider bite 1 week ago.  Soft tissue swelling.  EXAM: LEFT KNEE - COMPLETE 4+ VIEW  COMPARISON:  None.  FINDINGS: There is no evidence of fracture, dislocation, or joint effusion. There is no evidence of arthropathy or other focal bone abnormality. Diffuse soft tissue swelling in the calf.  IMPRESSION: No acute osseous findings are evident.   Electronically Signed   By: Elsie Stain M.D.   On: 10/07/2014 19:21    Scheduled Meds: . heparin  5,000 Units Subcutaneous 3 times per day  . nicotine  21 mg Transdermal Daily  . piperacillin-tazobactam (ZOSYN)  IV  3.375 g Intravenous 3 times per day  . sodium chloride  3 mL Intravenous Q12H  . vancomycin  1,000 mg Intravenous Q8H   Continuous Infusions: . sodium chloride 75 mL/hr at 10/08/14 1933    Principal Problem:   Cellulitis Active Problems:   Insect bite   Asthma   Abdominal pain   Hypokalemia   Pancreatitis    Time spent: 25 min    [facility]  [facility] Pager [phone]. If 7PM-7AM, please contact night-coverage at [email or web address], password [facility] 10/09/2014, 2:31 PM  LOS: 2 days

## 2014-10-09 NOTE — Progress Notes (Signed)
Utilization Review Completed.Pippa Hanif T9/11/2014  

## 2014-10-10 LAB — VANCOMYCIN, TROUGH: VANCOMYCIN TR: 13 ug/mL (ref 10.0–20.0)

## 2014-10-10 MED ORDER — HYDROCODONE-ACETAMINOPHEN 5-325 MG PO TABS: 1.0000 | ORAL_TABLET | ORAL | Status: DC | PRN

## 2014-10-10 MED ORDER — SULFAMETHOXAZOLE-TRIMETHOPRIM 800-160 MG PO TABS: 1.0000 | ORAL_TABLET | Freq: Two times a day (BID) | ORAL | Status: DC

## 2014-10-10 NOTE — Progress Notes (Addendum)
Went over all discharge information with patient.  Explained importance of taking medications as prescribed and following up with PCP.  All questions answered.  Prescriptions given.  Pt will be wheeled out by NT.  Per MD instructed patient to use crutches, partial weight bearing, and elevate leg. Office closed, unable to make follow up appointment with PCP.

## 2014-10-10 NOTE — Progress Notes (Addendum)
Pharmacy - Antibiotic Dosing  Assessment: 31 yoF with LLE cellulitis from insect bite, on D3 vancomycin per pharmacy.  Vancomycin trough this AM at goal 10-15 mcg/mL.  Remains afebrile, WBC wnl.  Plan:  Continue vancomycin 1g IV q8 hr  Would recheck trough after another six doses with q8 hr frequency if still on, but would suggest narrowing as patient appears clinically improved since 9/11.  Bernadene Person, PharmD, BCPS Pager: [phone] 10/10/2014, 9:48 AM

## 2014-10-10 NOTE — Progress Notes (Signed)
PT Cancellation Note  Patient Details Name: Tricia Hood MRN: [id number] DOB: 11-Aug-1970   Cancelled Treatment:     per chart review pt is still NWB and suppose to be wearing a short leg splint (do not see one in room or on pt).  Pt plans to be D/C to home today.  Have asked RN to clarify WBing status and splint before we see her again.  If pt needs to cont NWB, will issue and educate on crutches.   Armando Reichert 10/10/2014, 11:49 AM

## 2014-10-10 NOTE — Progress Notes (Signed)
Orthopedics Progress Note  Subjective: Patient reports much improvement in the leg since admission. Min pain.  Objective:  Filed Vitals:   10/10/14 0555  BP: 138/73  Pulse: 80  Temp: 98.2 F (36.8 C)  Resp: 20    General: Awake and alert  Musculoskeletal: Left proximal leg wound draining some, small 1 mm opening with bloody discharge.  Erythema is much better with just 3-4 cm around the wound/bite.  Compartments are supple and ankle ROM is pain free.  Knee ROM is pain free 0-90 degrees. Neurovascularly intact  Lab Results  Component Value Date   WBC 10.0 10/09/2014   HGB 11.3* 10/09/2014   HCT 34.7* 10/09/2014   MCV 96.1 10/09/2014   PLT 315 10/09/2014       Component Value Date/Time   NA 140 10/09/2014 0610   K 4.1 10/09/2014 0610   CL 105 10/09/2014 0610   CO2 29 10/09/2014 0610   GLUCOSE 102* 10/09/2014 0610   BUN 7 10/09/2014 0610   CREATININE 0.72 10/09/2014 0610   CALCIUM 9.0 10/09/2014 0610   GFRNONAA >60 10/09/2014 0610   GFRAA >60 10/09/2014 0610    Lab Results  Component Value Date   INR 0.90 10/07/2014   INR 0.95 01/22/2010   INR 0.90 01/20/2010    Assessment/Plan:  s/p insect bit with resulting cellulitis. Much improved with Zosyn and Vanc.  Patient has no evidence for compartment syndrome. From my standpoint she is safe to transition to oral abx and discharge home.  She would need to return for any relapse and consider a longer IV course. Will sign off, thanks!  Almedia Balls. Ranell Patrick, MD 10/10/2014 1:15 PM

## 2014-10-10 NOTE — Discharge Summary (Signed)
Physician Discharge Summary  Tricia Hood:[id number] DOB: [birth year] DOA: 10/07/2014  PCP: Kathreen Cosier, MD  Admit date: 10/07/2014 Discharge date: 10/10/2014  Time spent: *25 minutes  Recommendations for Outpatient Follow-up:  1. Follow up PCP in 2  weeks Discharge Diagnoses:  Principal Problem:   Cellulitis Active Problems:   Insect bite   Asthma   Abdominal pain   Hypokalemia   Pancreatitis   Discharge Condition: Stable  Diet recommendation: low salt diet  Filed Weights   10/08/14 0028  Weight: 100.5 kg (221 lb 9 oz)    History of present illness:  44 y.o. female with PMH of asthma, who present with left leg swelling, redness and pain, insect bite and mild abdominal pain. Patient states on Tuesday she noticed that she had been bitten on the lateral side of her left leg. Since Tuesday she reports increased redness and swelling to the area "all the way up and down my leg ". She reports associated numbness and tingling in her toes distal to the wound. She denies any fevers, but reports she has been hot and cold. She reports that the pain is severe, 10 out of 10 in severity, constant. She also reports mild abdominal pain over left lower quadrant and mild nausea, but no vomiting or diarrhea. He reports that her abdominal pain started after she was biten. In ED, patient was found to have WBC 12.5, lipase 1.2, temperature 100.4, tachycardia, potassium 3.4, renal functioning okay. Patient is admitted to inpatient for further evaluation and treatment. Orthopedic surgeon was consulted by ED.  Hospital Course:  44 y.o. female with PMH of asthma, who present with left leg swelling, redness and pain, insect bite and mild abdominal pain.Since Tuesday she reports increased redness and swelling to the area "all the way up and down my leg ". She reports associated numbness and tingling in her toes distal to the wound. She denies any fevers, but reports she has been hot and cold. She  reports that the pain is severe, 10 out of 10 in severity, constant. She also reports mild abdominal pain over left lower quadrant and mild nausea, but no vomiting or diarrhea. He reports that her abdominal pain started after she was biten. Patient found to have cellulitis, was seen by orthopedic surgeon and compartment syndrome was ruled out.  1. Left leg cellulitis- patient was started on doxycycline and Zosyn,  Doxycycline was changed  to vancomycin per pharmacy consultation. Orthopedics has been following the patient, no surgical intervention. Ortho recommends to discharge home today on oral antibiotics. Will send home on Po Bactrim DS 1 tab po bid for seven days. Vicodin prn for pain. 2. Asthma- stable continue when necessary albuterol 3. Hypokalemia- repleted, repeat potassium 4.1 4. Abdominal pain- patient had mild elevation of lipase 179, denies pain this morning. Repeat lipase 77  Procedures:  None  Consultations:  Orthopedics  Discharge Exam: Filed Vitals:   10/10/14 1345  BP: 132/87  Pulse: 74  Temp: 97.8 F (36.6 C)  Resp: 18    General: Appears in no acute distress Cardiovascular: S1S2 RRR Respiratory: Clear bilaterally Ext : Mild erythema of upper left leg  Discharge Instructions    Current Discharge Medication List    START taking these medications   Details  sulfamethoxazole-trimethoprim (BACTRIM DS,SEPTRA DS) 800-160 MG per tablet Take 1 tablet by mouth 2 (two) times daily. Qty: 14 tablet, Refills: 0      CONTINUE these medications which have CHANGED   Details  HYDROcodone-acetaminophen (  NORCO/VICODIN) 5-325 MG per tablet Take 1-2 tablets by mouth every 4 (four) hours as needed for moderate pain or severe pain. Qty: 20 tablet, Refills: 0      CONTINUE these medications which have NOT CHANGED   Details  acetaminophen (TYLENOL) 500 MG tablet Take 1,000 mg by mouth every 6 (six) hours as needed for mild pain, moderate pain or headache.    albuterol  (PROVENTIL HFA;VENTOLIN HFA) 108 (90 BASE) MCG/ACT inhaler Inhale 1-2 puffs into the lungs every 6 (six) hours as needed for wheezing. Qty: 1 Inhaler, Refills: 0    hydrOXYzine (ATARAX/VISTARIL) 25 MG tablet Take 1 tablet (25 mg total) by mouth at bedtime and may repeat dose one time if needed. Qty: 30 tablet, Refills: 0      STOP taking these medications     predniSONE (DELTASONE) 20 MG tablet        Allergies  Allergen Reactions  . Nsaids Anaphylaxis      The results of significant diagnostics from this hospitalization (including imaging, microbiology, ancillary and laboratory) are listed below for reference.    Significant Diagnostic Studies: Dg Tibia/fibula Left  10/07/2014   CLINICAL DATA:  Recent spider bite. Redness and swelling of the lower leg.  EXAM: LEFT TIBIA AND FIBULA - 2 VIEW  COMPARISON:  None.  FINDINGS: There is no evidence of fracture or other focal bone lesions. Diffuse soft tissue swelling. No radiopaque foreign body.  IMPRESSION: Diffuse soft tissue swelling.  No osseous finding.   Electronically Signed   By: Elsie Stain M.D.   On: 10/07/2014 19:20   US Abdomen Complete  10/08/2014   CLINICAL DATA:  Left-sided abdominal pain  EXAM: ULTRASOUND ABDOMEN COMPLETE  COMPARISON:  Abdominal CT 03/16/2011  FINDINGS: Gallbladder: No gallstones or wall thickening visualized. No sonographic Murphy sign noted.  Common bile duct: Diameter: 5 mm  Liver: No focal lesion identified. Mildly heterogeneous and echogenic, possible mild fatty infiltration. Antegrade flow in the imaged portal venous system.  IVC: No abnormality visualized.  Pancreas: Visualized portion unremarkable.  Spleen: Size and appearance within normal limits.  Right Kidney: Length: 12 cm. Echogenicity within normal limits. No mass or hydronephrosis visualized.  Left Kidney: Length: 12 cm. Echogenicity within normal limits. No mass or hydronephrosis visualized.  Abdominal aorta: Limited visualization of the aorta  with no evidence of aneurysm. No aneurysm present in 2013.  IMPRESSION: Negative abdominal ultrasound.  No explanation for pain.   Electronically Signed   By: Marnee Spring M.D.   On: 10/08/2014 11:57   Dg Knee Complete 4 Views Left  10/07/2014   CLINICAL DATA:  Spider bite 1 week ago.  Soft tissue swelling.  EXAM: LEFT KNEE - COMPLETE 4+ VIEW  COMPARISON:  None.  FINDINGS: There is no evidence of fracture, dislocation, or joint effusion. There is no evidence of arthropathy or other focal bone abnormality. Diffuse soft tissue swelling in the calf.  IMPRESSION: No acute osseous findings are evident.   Electronically Signed   By: Elsie Stain M.D.   On: 10/07/2014 19:21    Microbiology: Recent Results (from the past 240 hour(s))  Culture, blood (x 2)     Status: None (Preliminary result)   Collection Time: 10/07/14 11:00 PM  Result Value Ref Range Status   Specimen Description BLOOD RIGHT ANTECUBITAL  Final   Special Requests BOTTLES DRAWN AEROBIC AND ANAEROBIC  Final   Culture   Final    NO GROWTH 2 DAYS Performed at [facility]  Report Status PENDING  Incomplete  Culture, blood (x 2)     Status: None (Preliminary result)   Collection Time: 10/07/14 11:29 PM  Result Value Ref Range Status   Specimen Description BLOOD RIGHT HAND  Final   Special Requests BOTTLES DRAWN AEROBIC AND ANAEROBIC  Final   Culture   Final    NO GROWTH 2 DAYS Performed at [facility]    Report Status PENDING  Incomplete     Labs: Basic Metabolic Panel:  Recent Labs Lab 10/07/14 1842 10/08/14 0516 10/09/14 0610  NA 139 139 140  K 3.4* 3.9 4.1  CL 102 109 105  CO2 29 26 29   GLUCOSE 108* 103* 102*  BUN 11 10 7   CREATININE 0.77 0.71 0.72  CALCIUM 9.3 8.1* 9.0   Liver Function Tests:  Recent Labs Lab 10/08/14 0516  AST 16  17  ALT 16  18  ALKPHOS 46  46  BILITOT 0.5  0.7  PROT 6.3*  6.4*  ALBUMIN 3.3*  3.2*    Recent Labs Lab 10/07/14 2252 10/09/14 0610   LIPASE 179* 77*   No results for input(s): AMMONIA in the last 168 hours. CBC:  Recent Labs Lab 10/07/14 1842 10/08/14 0516 10/09/14 0610  WBC 12.5* 10.2 10.0  NEUTROABS 7.8*  --   --   HGB 13.0 11.5* 11.3*  HCT 38.5 34.7* 34.7*  MCV 94.4 95.6 96.1  PLT 329 296 315       Signed:  Adiel Erney S  [facility] 10/10/2014, 3:11 PM

## 2014-10-10 NOTE — Progress Notes (Signed)
Physical Therapy Treatment Patient Details Name: Tricia Hood MRN: [id number] DOB: 07-Apr-1970 Today's Date: 10/10/2014    History of Present Illness 44 yo female admitted with L LE cellutlits.     PT Comments    Per RN pt is  PWB (per Dr Ranell Patrick) and needs crutch training before D/C today.  Crutches issued and sized.  Amb pt in hallway a greater distance.  Instructed on proper tech and safety.  Instructed on proper sequencing while negotiating stairs.  Instructed on importance of L LE elevation while at rest and brief use of ICE after activity.  Pt ready for D/c to home.  Follow Up Recommendations  No PT follow up     Equipment Recommendations  Crutches (sized and issued during Tx session)    Recommendations for Other Services       Precautions / Restrictions Precautions Precautions: Fall Restrictions Weight Bearing Restrictions: No LLE Weight Bearing: Partial weight bearing LLE Partial Weight Bearing Percentage or Pounds: PWB per RN Katie who spoke to Dr Ranell Patrick.    Mobility  Bed Mobility Overal bed mobility: Modified Independent                Transfers Overall transfer level: Modified independent               General transfer comment: good safety cognition  Ambulation/Gait Ambulation/Gait assistance: Supervision Ambulation Distance (Feet): 140 Feet Assistive device: Crutches Gait Pattern/deviations: Step-through pattern;Decreased stride length     General Gait Details: educated and instructed on proper tech to amb with crutches while PWB.     Stairs            Wheelchair Mobility    Modified Rankin (Stroke Patients Only)       Balance                                    Cognition Arousal/Alertness: Awake/alert Behavior During Therapy: WFL for tasks assessed/performed Overall Cognitive Status: Within Functional Limits for tasks assessed                      Exercises      General Comments         Pertinent Vitals/Pain Pain Assessment: 0-10 Pain Score: 5  Pain Location: L LE Pain Descriptors / Indicators: Sharp;Tender;Stabbing Pain Intervention(s): Monitored during session;Repositioned    Home Living                      Prior Function            PT Goals (current goals can now be found in the care plan section) Progress towards PT goals: Progressing toward goals    Frequency  Min 3X/week    PT Plan      Co-evaluation             End of Session Equipment Utilized During Treatment: Gait belt Activity Tolerance: Patient tolerated treatment well Patient left: in chair;with call bell/phone within reach     Time: 1430-1450 PT Time Calculation (min) (ACUTE ONLY): 20 min  Charges:  $Gait Training: 8-22 mins                    G Codes:      Felecia Shelling  PTA [facility]  Acute  Rehab Pager      [phone]

## 2014-10-13 LAB — CULTURE, BLOOD (ROUTINE X 2)
CULTURE: NO GROWTH
CULTURE: NO GROWTH

## 2015-06-03 ENCOUNTER — Encounter (HOSPITAL_COMMUNITY): Payer: Self-pay

## 2015-06-03 ENCOUNTER — Emergency Department (HOSPITAL_COMMUNITY)
Admission: EM | Admit: 2015-06-03 | Discharge: 2015-06-04 | Disposition: A | Discharge status: Home / Self Care | Payer: Medicaid Other | Attending: Emergency Medicine | Admitting: Emergency Medicine

## 2015-06-03 ENCOUNTER — Emergency Department (HOSPITAL_COMMUNITY): Payer: Medicaid Other

## 2015-06-03 DIAGNOSIS — Z3202 Encounter for pregnancy test, result negative: Secondary | ICD-10-CM | POA: Insufficient documentation

## 2015-06-03 DIAGNOSIS — K5732 Diverticulitis of large intestine without perforation or abscess without bleeding: Secondary | ICD-10-CM

## 2015-06-03 DIAGNOSIS — Z792 Long term (current) use of antibiotics: Secondary | ICD-10-CM | POA: Diagnosis not present

## 2015-06-03 DIAGNOSIS — F17210 Nicotine dependence, cigarettes, uncomplicated: Secondary | ICD-10-CM | POA: Diagnosis not present

## 2015-06-03 DIAGNOSIS — J45909 Unspecified asthma, uncomplicated: Secondary | ICD-10-CM | POA: Diagnosis not present

## 2015-06-03 DIAGNOSIS — R000 Tachycardia, unspecified: Secondary | ICD-10-CM | POA: Diagnosis not present

## 2015-06-03 DIAGNOSIS — R1030 Lower abdominal pain, unspecified: Secondary | ICD-10-CM | POA: Diagnosis present

## 2015-06-03 LAB — CBC
HCT: 36.6 % (ref 36.0–46.0)
Hemoglobin: 12.6 g/dL (ref 12.0–15.0)
MCH: 31.3 pg (ref 26.0–34.0)
MCHC: 34.4 g/dL (ref 30.0–36.0)
MCV: 90.8 fL (ref 78.0–100.0)
Platelets: 281 10*3/uL (ref 150–400)
RBC: 4.03 MIL/uL (ref 3.87–5.11)
RDW: 13 % (ref 11.5–15.5)
WBC: 12.6 10*3/uL — ABNORMAL HIGH (ref 4.0–10.5)

## 2015-06-03 LAB — URINALYSIS, ROUTINE W REFLEX MICROSCOPIC
Bilirubin Urine: NEGATIVE
Glucose, UA: NEGATIVE mg/dL
Hgb urine dipstick: NEGATIVE
Ketones, ur: NEGATIVE mg/dL
Leukocytes, UA: NEGATIVE
Nitrite: NEGATIVE
Protein, ur: NEGATIVE mg/dL
Specific Gravity, Urine: 1.035 — ABNORMAL HIGH (ref 1.005–1.030)
pH: 6 (ref 5.0–8.0)

## 2015-06-03 LAB — COMPREHENSIVE METABOLIC PANEL
ALT: 21 U/L (ref 14–54)
AST: 23 U/L (ref 15–41)
Albumin: 4 g/dL (ref 3.5–5.0)
Alkaline Phosphatase: 46 U/L (ref 38–126)
Anion gap: 11 (ref 5–15)
BUN: 18 mg/dL (ref 6–20)
CO2: 21 mmol/L — ABNORMAL LOW (ref 22–32)
Calcium: 8.9 mg/dL (ref 8.9–10.3)
Chloride: 101 mmol/L (ref 101–111)
Creatinine, Ser: 0.72 mg/dL (ref 0.44–1.00)
GFR calc Af Amer: >60 mL/min (ref >60)
GFR calc non Af Amer: >60 mL/min (ref >60)
Glucose, Bld: 110 mg/dL — ABNORMAL HIGH (ref 65–99)
Potassium: 3.6 mmol/L (ref 3.5–5.1)
Sodium: 133 mmol/L — ABNORMAL LOW (ref 135–145)
Total Bilirubin: 0.3 mg/dL (ref 0.3–1.2)
Total Protein: 7.5 g/dL (ref 6.5–8.1)

## 2015-06-03 LAB — I-STAT BETA HCG BLOOD, ED (MC, WL, AP ONLY): I-stat hCG, quantitative: <5 m[IU]/mL (ref <5)

## 2015-06-03 LAB — LIPASE, BLOOD: Lipase: 24 U/L (ref 11–51)

## 2015-06-03 MED ORDER — SODIUM CHLORIDE 0.9 % IV BOLUS (SEPSIS)
1000.0000 mL | Freq: Once | INTRAVENOUS | Status: AC
  Administered 2015-06-03: 1000 mL via INTRAVENOUS

## 2015-06-03 MED ORDER — METRONIDAZOLE 500 MG PO TABS: 500.0000 mg | ORAL_TABLET | Freq: Three times a day (TID) | ORAL | Status: DC

## 2015-06-03 MED ORDER — CIPROFLOXACIN HCL 500 MG PO TABS: 500.0000 mg | ORAL_TABLET | Freq: Two times a day (BID) | ORAL | Status: DC

## 2015-06-03 MED ORDER — CIPROFLOXACIN IN D5W 400 MG/200ML IV SOLN
400.0000 mg | Freq: Once | INTRAVENOUS | Status: AC
  Administered 2015-06-03: 400 mg via INTRAVENOUS
  Filled 2015-06-03: qty 200

## 2015-06-03 MED ORDER — IOPAMIDOL (ISOVUE-300) INJECTION 61%
100.0000 mL | Freq: Once | INTRAVENOUS | Status: AC | PRN
  Administered 2015-06-03: 100 mL via INTRAVENOUS

## 2015-06-03 MED ORDER — HYDROMORPHONE HCL 1 MG/ML IJ SOLN
1.0000 mg | Freq: Once | INTRAMUSCULAR | Status: AC
  Administered 2015-06-03: 1 mg via INTRAVENOUS
  Filled 2015-06-03: qty 1

## 2015-06-03 MED ORDER — ONDANSETRON HCL 4 MG/2ML IJ SOLN
4.0000 mg | Freq: Once | INTRAMUSCULAR | Status: AC
  Administered 2015-06-03: 4 mg via INTRAVENOUS
  Filled 2015-06-03: qty 2

## 2015-06-03 MED ORDER — METRONIDAZOLE IN NACL 5-0.79 MG/ML-% IV SOLN
500.0000 mg | Freq: Once | INTRAVENOUS | Status: AC
  Administered 2015-06-03: 500 mg via INTRAVENOUS
  Filled 2015-06-03: qty 100

## 2015-06-03 MED ORDER — OXYCODONE-ACETAMINOPHEN 5-325 MG PO TABS: 1.0000 | ORAL_TABLET | ORAL | Status: DC | PRN

## 2015-06-03 NOTE — ED Notes (Signed)
Pt ambulated to restroom without difficulty or assistance.  

## 2015-06-03 NOTE — ED Notes (Signed)
Pt c/o L side abdominal pain/pressure, low back pain, and n/v/d x 1 day.  Pain score 10/10.  Pt reports taking Tylenol w/o relief.  Denies dysuria and vaginal discharge.

## 2015-06-03 NOTE — ED Notes (Signed)
Pt transported to CT ?

## 2015-06-03 NOTE — ED Provider Notes (Signed)
CSN: [id number]     Arrival date & time 06/03/15  1841 History   First MD Initiated Contact with Patient 06/03/15 1944     Chief Complaint  Patient presents with  . Abdominal Pain  . Back Pain     (Consider location/radiation/quality/duration/timing/severity/associated sxs/prior Treatment) HPI   45 year old female with abdominal pain. Gradual onset yesterday. Progressively worsening since then. Pain is across the lower abdomen and radiates into her left lower back. Constant pain with occasional sharper pain which comes in waves. She says it feels like she is in labor. Pain is worse with movement and with walking. She is felt nauseated but denies any vomiting to me. She has had several watery stools. No blood. No urinary complaints. No fevers or chills. Denies any past abdominal/pelvic surgery. Has tried taking Tylenol with no significant improvement.  Past Medical History  Diagnosis Date  . Asthma    Past Surgical History  Procedure Laterality Date  . Tubal ligation    . Ankle fracture surgery    . Tonsillectomy     Family History  Problem Relation Age of Onset  . Hypertension Mother    Social History  Substance Use Topics  . Smoking status: Current Every Day Smoker -- 1.00 packs/day    Types: Cigarettes  . Smokeless tobacco: Never Used  . Alcohol Use: No   OB History    No data available     Review of Systems  All systems reviewed and negative, other than as noted in HPI.   Allergies  Nsaids  Home Medications   Prior to Admission medications   Medication Sig Start Date End Date Taking? Authorizing Provider  acetaminophen (TYLENOL) 500 MG tablet Take 1,000 mg by mouth every 6 (six) hours as needed for mild pain, moderate pain or headache.    Historical Provider, MD  albuterol (PROVENTIL HFA;VENTOLIN HFA) 108 (90 BASE) MCG/ACT inhaler Inhale 1-2 puffs into the lungs every 6 (six) hours as needed for wheezing. Patient not taking: Reported on 10/07/2014 10/23/12    Santiago GladHeather Laisure, PA-C  HYDROcodone-acetaminophen (NORCO/VICODIN) 5-325 MG per tablet Take 1-2 tablets by mouth every 4 (four) hours as needed for moderate pain or severe pain. 10/10/14   Meredeth IdeGagan S Lama, MD  hydrOXYzine (ATARAX/VISTARIL) 25 MG tablet Take 1 tablet (25 mg total) by mouth at bedtime and may repeat dose one time if needed. Patient not taking: Reported on 10/07/2014 03/09/13   Nelva NayHeather M Marte, PA-C  sulfamethoxazole-trimethoprim (BACTRIM DS,SEPTRA DS) 800-160 MG per tablet Take 1 tablet by mouth 2 (two) times daily. 10/10/14   Meredeth IdeGagan S Lama, MD   BP 144/93 mmHg  Pulse 109  Temp(Src) 98.6 F (37 C) (Oral)  Resp 18  Ht 5\' 5"  (1.651 m)  Wt 215 lb (97.523 kg)  BMI 35.78 kg/m2  SpO2 100%  LMP 05/07/2015 Physical Exam  Constitutional: She is oriented to person, place, and time. She appears well-developed and well-nourished. No distress.  HENT:  Head: Normocephalic and atraumatic.  Eyes: Conjunctivae are normal. Right eye exhibits no discharge. Left eye exhibits no discharge.  Neck: Neck supple.  Cardiovascular: Regular rhythm and normal heart sounds.  Exam reveals no gallop and no friction rub.   No murmur heard. Mild tachycardia  Pulmonary/Chest: Effort normal and breath sounds normal. No respiratory distress.  Abdominal: Soft. She exhibits no distension. There is tenderness. There is no rebound and no guarding.  Diffuse abdominal tenderness, but worse across the lower abdomen. Voluntary guarding with deep palpation. No rebound. No  distention.  Musculoskeletal: She exhibits no edema or tenderness.  Neurological: She is alert and oriented to person, place, and time.  Skin: Skin is warm and dry.  Psychiatric: She has a normal mood and affect. Her behavior is normal. Thought content normal.  Nursing note and vitals reviewed.   ED Course  Procedures (including critical care time) Labs Review Labs Reviewed  COMPREHENSIVE METABOLIC PANEL - Abnormal; Notable for the following:     Sodium 133 (*)    CO2 21 (*)    Glucose, Bld 110 (*)    All other components within normal limits  CBC - Abnormal; Notable for the following:    WBC 12.6 (*)    All other components within normal limits  URINALYSIS, ROUTINE W REFLEX MICROSCOPIC (NOT AT [facility]) - Abnormal; Notable for the following:    Specific Gravity, Urine 1.035 (*)    All other components within normal limits  LIPASE, BLOOD  I-STAT BETA HCG BLOOD, ED ([facility], [facility], [facility] ONLY)    Imaging Review No results found. I have personally reviewed and evaluated these images and lab results as part of my medical decision-making.   EKG Interpretation None      MDM   Final diagnoses:  Diverticulitis of large intestine without perforation or abscess without bleeding    Diverticulitis w/o perf or abscess. Should be fine for outpt tx. It has been determined that no acute conditions requiring further emergency intervention are present at this time. The patient has been advised of the diagnosis and plan. I reviewed any labs and imaging including any potential incidental findings. We have discussed signs and symptoms that warrant return to the ED and they are listed in the discharge instructions.      Raeford Razor, MD 06/08/15 1239

## 2015-06-03 NOTE — Discharge Instructions (Signed)
Diverticulitis °Diverticulitis is inflammation or infection of small pouches in your colon that form when you have a condition called diverticulosis. The pouches in your colon are called diverticula. Your colon, or large intestine, is where water is absorbed and stool is formed. °Complications of diverticulitis can include: °· Bleeding. °· Severe infection. °· Severe pain. °· Perforation of your colon. °· Obstruction of your colon. °CAUSES  °Diverticulitis is caused by bacteria. °Diverticulitis happens when stool becomes trapped in diverticula. This allows bacteria to grow in the diverticula, which can lead to inflammation and infection. °RISK FACTORS °People with diverticulosis are at risk for diverticulitis. Eating a diet that does not include enough fiber from fruits and vegetables may make diverticulitis more likely to develop. °SYMPTOMS  °Symptoms of diverticulitis may include: °· Abdominal pain and tenderness. The pain is normally located on the left side of the abdomen, but may occur in other areas. °· Fever and chills. °· Bloating. °· Cramping. °· Nausea. °· Vomiting. °· Constipation. °· Diarrhea. °· Blood in your stool. °DIAGNOSIS  °Your health care provider will ask you about your medical history and do a physical exam. You may need to have tests done because many medical conditions can cause the same symptoms as diverticulitis. Tests may include: °· Blood tests. °· Urine tests. °· Imaging tests of the abdomen, including X-rays and CT scans. °When your condition is under control, your health care provider may recommend that you have a colonoscopy. A colonoscopy can show how severe your diverticula are and whether something else is causing your symptoms. °TREATMENT  °Most cases of diverticulitis are mild and can be treated at home. Treatment may include: °· Taking over-the-counter pain medicines. °· Following a clear liquid diet. °· Taking antibiotic medicines by mouth for 7-10 days. °More severe cases may  be treated at a hospital. Treatment may include: °· Not eating or drinking. °· Taking prescription pain medicine. °· Receiving antibiotic medicines through an IV tube. °· Receiving fluids and nutrition through an IV tube. °· Surgery. °HOME CARE INSTRUCTIONS  °· Follow your health care provider's instructions carefully. °· Follow a full liquid diet or other diet as directed by your health care provider. After your symptoms improve, your health care provider may tell you to change your diet. He or she may recommend you eat a high-fiber diet. Fruits and vegetables are good sources of fiber. Fiber makes it easier to pass stool. °· Take fiber supplements or probiotics as directed by your health care provider. °· Only take medicines as directed by your health care provider. °· Keep all your follow-up appointments. °SEEK MEDICAL CARE IF:  °· Your pain does not improve. °· You have a hard time eating food. °· Your bowel movements do not return to normal. °SEEK IMMEDIATE MEDICAL CARE IF:  °· Your pain becomes worse. °· Your symptoms do not get better. °· Your symptoms suddenly get worse. °· You have a fever. °· You have repeated vomiting. °· You have bloody or black, tarry stools. °MAKE SURE YOU:  °· Understand these instructions. °· Will watch your condition. °· Will get help right away if you are not doing well or get worse. °  °This information is not intended to replace advice given to you by your health care provider. Make sure you discuss any questions you have with your health care provider. °  °Document Released: 10/24/2004 Document Revised: 01/19/2013 Document Reviewed: 12/09/2012 °Elsevier Interactive Patient Education ©2016 [employer] ° °

## 2016-06-03 ENCOUNTER — Encounter (HOSPITAL_COMMUNITY): Payer: Self-pay | Admitting: *Deleted

## 2016-06-03 ENCOUNTER — Emergency Department (HOSPITAL_COMMUNITY)
Admission: EM | Admit: 2016-06-03 | Discharge: 2016-06-03 | Disposition: A | Discharge status: Home / Self Care | Payer: Medicaid Other | Attending: Emergency Medicine | Admitting: Emergency Medicine

## 2016-06-03 DIAGNOSIS — X58XXXA Exposure to other specified factors, initial encounter: Secondary | ICD-10-CM | POA: Insufficient documentation

## 2016-06-03 DIAGNOSIS — Y999 Unspecified external cause status: Secondary | ICD-10-CM | POA: Insufficient documentation

## 2016-06-03 DIAGNOSIS — R21 Rash and other nonspecific skin eruption: Secondary | ICD-10-CM | POA: Insufficient documentation

## 2016-06-03 DIAGNOSIS — R220 Localized swelling, mass and lump, head: Secondary | ICD-10-CM | POA: Insufficient documentation

## 2016-06-03 DIAGNOSIS — T7840XA Allergy, unspecified, initial encounter: Secondary | ICD-10-CM

## 2016-06-03 DIAGNOSIS — Y929 Unspecified place or not applicable: Secondary | ICD-10-CM | POA: Insufficient documentation

## 2016-06-03 DIAGNOSIS — Y939 Activity, unspecified: Secondary | ICD-10-CM | POA: Insufficient documentation

## 2016-06-03 MED ORDER — PREDNISONE 10 MG PO TABS: 40.0000 mg | ORAL_TABLET | Freq: Every day | ORAL | 0 refills | Status: AC

## 2016-06-03 MED ORDER — DIPHENHYDRAMINE HCL 25 MG PO CAPS
50.0000 mg | ORAL_CAPSULE | Freq: Once | ORAL | Status: AC
  Administered 2016-06-03: 50 mg via ORAL
  Filled 2016-06-03: qty 2

## 2016-06-03 MED ORDER — FAMOTIDINE 20 MG PO TABS
40.0000 mg | ORAL_TABLET | Freq: Once | ORAL | Status: AC
  Administered 2016-06-03: 40 mg via ORAL
  Filled 2016-06-03: qty 2

## 2016-06-03 MED ORDER — EPINEPHRINE 0.3 MG/0.3ML IJ SOAJ: 0.3000 mg | Freq: Once | INTRAMUSCULAR | 0 refills | Status: AC

## 2016-06-03 NOTE — Discharge Instructions (Signed)
Please use Benadryl as needed for itching. Please take prednisone with breakfast every morning. Take everyday for 5 days. Do not skip a day. Use EpiPen on emergency basis only.  Get help right away if: You have symptoms of anaphylaxis, such as: Swollen mouth, tongue, or throat. Pain or tightness in your chest. Trouble breathing or shortness of breath. Dizziness or fainting. Severe abdominal pain, vomiting, or diarrhea

## 2016-06-03 NOTE — ED Triage Notes (Signed)
Pt arrives from home via [facility]. Pt states she woke up this morning with a swollen lip and hives. Pt was wearing a dress and had hives primarily where the elastic in the dress was located. Pt received 50mg  benadryl, 0.3 epi IM, 125mg  solumedrol and 5 albuterol neb PTA. Pt states she is feeling better.

## 2016-06-03 NOTE — ED Provider Notes (Signed)
[facility]-EMERGENCY DEPT Provider Note   CSN: [id number] Arrival date & time: 06/03/16  [id number]     History   Chief Complaint Chief Complaint  Patient presents with  . Allergic Reaction    HPI Tricia Hood is a 46 y.o. female home via EMS with PMHx of pancreatitis, cellulitis, asthma presents today with complaints of an allergic reaction this morning while taking her daughter to school. She reports her symptoms started with lip swelling and then she felt "tingling" in her throat, shortness of breath and hives. She denies any swelling of top lip and tongue. She denies ever having anything like this before. She reports taking allergy pills at home and ice at home with no relief. She proceeded to call [facility] who gave her benadryl, epi, solumedrol and albuterol PTA. She reports feeling better now. She denies any shortness of breath, feeling of throat closure. She reports her lip still being swollen but has not worsened. She also reports continued itching that has worsened throughout body. She denies fever, chills, chest pain, abdominal pain, n/v/d. She reports only having seasonal allergies.  She denies any changes in creams, lotions, detergents, change in medications, change in food intake, insect bites. She does not know what may have caused her reaction. She denies any lisinopril use.  The history is provided by the patient. No language interpreter was used.  Allergic Reaction  Presenting symptoms: rash   Presenting symptoms: no wheezing     Past Medical History:  Diagnosis Date  . Asthma     Patient Active Problem List   Diagnosis Date Noted  . Pancreatitis 10/08/2014  . Cellulitis 10/07/2014  . Insect bite 10/07/2014  . Asthma 10/07/2014  . Abdominal pain 10/07/2014  . Hypokalemia 10/07/2014    Past Surgical History:  Procedure Laterality Date  . ANKLE FRACTURE SURGERY    . TONSILLECTOMY    . TUBAL LIGATION      OB History    No data available       Home Medications      Prior to Admission medications   Medication Sig Start Date End Date Taking? Authorizing Provider  acetaminophen (TYLENOL) 500 MG tablet Take 1,000 mg by mouth every 6 (six) hours as needed for mild pain, moderate pain or headache.    [provider]  albuterol (PROVENTIL HFA;VENTOLIN HFA) 108 (90 BASE) MCG/ACT inhaler Inhale 1-2 puffs into the lungs every 6 (six) hours as needed for wheezing. 10/23/12   Santiago GladLaisure, Heather, PA-C  ciprofloxacin (CIPRO) 500 MG tablet Take 1 tablet (500 mg total) by mouth every 12 (twelve) hours. 06/03/15   Raeford RazorKohut, Stephen, MD  EPINEPHrine (EPIPEN 2-PAK) 0.3 mg/0.3 mL IJ SOAJ injection Inject 0.3 mLs (0.3 mg total) into the muscle once. 06/03/16 06/03/16  Alvina ChouEspina, Herron Fero Manuel, PA  HYDROcodone-acetaminophen (NORCO/VICODIN) 5-325 MG per tablet Take 1-2 tablets by mouth every 4 (four) hours as needed for moderate pain or severe pain. Patient not taking: Reported on 06/03/2015 10/10/14   Meredeth IdeLama, Gagan S, MD  hydrOXYzine (ATARAX/VISTARIL) 25 MG tablet Take 1 tablet (25 mg total) by mouth at bedtime and may repeat dose one time if needed. Patient not taking: Reported on 10/07/2014 03/09/13   Nelva NayMarte, Heather M, PA-C  metroNIDAZOLE (FLAGYL) 500 MG tablet Take 1 tablet (500 mg total) by mouth 3 (three) times daily. 06/03/15   Raeford RazorKohut, Stephen, MD  oxyCODONE-acetaminophen (PERCOCET/ROXICET) 5-325 MG tablet Take 1 tablet by mouth every 4 (four) hours as needed for severe pain. 06/03/15   Raeford RazorKohut, Stephen,  MD  predniSONE (DELTASONE) 10 MG tablet Take 4 tablets (40 mg total) by mouth daily with breakfast. 06/03/16 06/08/16  Alvina Chou, PA    Family History Family History  Problem Relation Age of Onset  . Hypertension Mother     Social History Social History  Substance Use Topics  . Smoking status: Current Every Day Smoker    Packs/day: 1.00    Types: Cigarettes  . Smokeless tobacco: Never Used  . Alcohol use No     Allergies   Nsaids   Review of Systems Review  of Systems  Constitutional: Negative for chills and fever.  HENT:       Lower lip swelling  (Throat "tingling" Resolved)  Respiratory: Positive for shortness of breath (Resolved). Negative for wheezing.   Cardiovascular: Negative for chest pain.  Gastrointestinal: Negative for abdominal pain, diarrhea, nausea and vomiting.  Skin: Positive for rash.  All other systems reviewed and are negative.    Physical Exam Updated Vital Signs BP (!) 142/95 (BP Location: Right Arm)   Pulse 77   Temp 98.1 F (36.7 C) (Oral)   Resp 18   Ht 5\' 6"  (1.676 m)   Wt 97.5 kg   SpO2 98%   BMI 34.70 kg/m   Physical Exam  Constitutional: She appears well-developed and well-nourished. No distress.  Well appearing. Airway patent. No trismus, drooling, stridor. Handling secretions well.  HENT:  Head: Normocephalic and atraumatic.  Nose: Nose normal.  Mouth/Throat: Oropharynx is clear and moist.  Lower lip with mild swelling. Upper lip and tongue with no swelling noted.   Eyes: Conjunctivae and EOM are normal. Pupils are equal, round, and reactive to light.  Neck: Normal range of motion. No JVD present. No tracheal deviation present.  Cardiovascular: Normal rate, normal heart sounds and intact distal pulses.   No murmur heard. Pulmonary/Chest: Effort normal and breath sounds normal. No stridor. No respiratory distress. She has no wheezes. She has no rales.  Normal work of breathing. No respiratory distress noted.   Abdominal: Soft. Bowel sounds are normal. There is no tenderness. There is no rebound and no guarding.  Musculoskeletal: Normal range of motion.  Neurological: She is alert.  Skin: Skin is warm.  No hives noted skin. Though she does complain of itchiness.  Psychiatric: She has a normal mood and affect. Her behavior is normal.  Nursing note and vitals reviewed.    ED Treatments / Results  Labs (all labs ordered are listed, but only abnormal results are displayed) Labs Reviewed - No  data to display  EKG  EKG Interpretation None       Radiology No results found.  Procedures Procedures (including critical care time)  Medications Ordered in ED Medications  diphenhydrAMINE (BENADRYL) capsule 50 mg (not administered)  famotidine (PEPCID) tablet 40 mg (40 mg Oral Given 06/03/16 1113)     Initial Impression / Assessment and Plan / ED Course  I have reviewed the triage vital signs and the nursing notes.  Pertinent labs & imaging results that were available during my care of the patient were reviewed by me and considered in my medical decision making (see chart for details).    Patient re-evaluated prior to dc, is hemodynamically stable, in no respiratory distress, and denies the feeling of throat closing. Patient has been monitored here for over 4 hours with no worsening of symptoms. I feel safe for discharge at this time. Pt has been advised to take OTC benadryl & prednisone & return to  the ED if they have a mod-severe allergic rxn (s/s including throat closing, difficulty breathing, swelling of lips face or tongue). Patient also given EpiPen. Pt is to follow up with their PCP. Pt is agreeable with plan & verbalizes understanding.   Final Clinical Impressions(s) / ED Diagnoses   Final diagnoses:  Allergic reaction, initial encounter    New Prescriptions New Prescriptions   EPINEPHRINE (EPIPEN 2-PAK) 0.3 MG/0.3 ML IJ SOAJ INJECTION    Inject 0.3 mLs (0.3 mg total) into the muscle once.   PREDNISONE (DELTASONE) 10 MG TABLET    Take 4 tablets (40 mg total) by mouth daily with breakfast.     Alvina Chou, [place] 06/03/16 1308    Margarita Grizzle, MD 06/04/16 1039

## 2017-04-12 ENCOUNTER — Ambulatory Visit (HOSPITAL_COMMUNITY)
Admission: EM | Admit: 2017-04-12 | Discharge: 2017-04-12 | Disposition: A | Discharge status: Home / Self Care | Payer: Self-pay | Attending: Family Medicine | Admitting: Family Medicine

## 2017-04-12 ENCOUNTER — Encounter (HOSPITAL_COMMUNITY): Payer: Self-pay

## 2017-04-12 ENCOUNTER — Other Ambulatory Visit: Payer: Self-pay

## 2017-04-12 DIAGNOSIS — M5442 Lumbago with sciatica, left side: Secondary | ICD-10-CM

## 2017-04-12 MED ORDER — METHYLPREDNISOLONE ACETATE 40 MG/ML IJ SUSP
INTRAMUSCULAR | Status: AC
  Filled 2017-04-12: qty 1

## 2017-04-12 MED ORDER — HYDROCODONE-ACETAMINOPHEN 5-325 MG PO TABS: 1.0000 | ORAL_TABLET | ORAL | 0 refills | Status: AC | PRN

## 2017-04-12 MED ORDER — PREDNISONE 50 MG PO TABS: 50.0000 mg | ORAL_TABLET | Freq: Every day | ORAL | 0 refills | Status: AC

## 2017-04-12 MED ORDER — HYDROCODONE-ACETAMINOPHEN 5-325 MG PO TABS
ORAL_TABLET | ORAL | Status: AC
  Filled 2017-04-12: qty 1

## 2017-04-12 MED ORDER — HYDROCODONE-ACETAMINOPHEN 5-325 MG PO TABS
1.0000 | ORAL_TABLET | Freq: Once | ORAL | Status: AC
  Administered 2017-04-12: 1 via ORAL

## 2017-04-12 MED ORDER — METHYLPREDNISOLONE ACETATE 40 MG/ML IJ SUSP
40.0000 mg | Freq: Once | INTRAMUSCULAR | Status: AC
  Administered 2017-04-12: 40 mg via INTRAMUSCULAR

## 2017-04-12 NOTE — ED Triage Notes (Signed)
Pt presents today with back that starts from her back and radiates down right leg. States it feels like pin and needles and hurts to touch. Started yesterday. Tylenol and ibuprofen has not relieved pain.

## 2017-04-12 NOTE — Discharge Instructions (Signed)
We gave you a shot of Depo-Medrol today, this is a steroid.  Please begin prednisone 50 mg daily for the next 5 days, please begin tomorrow.  Please use Tylenol and ibuprofen at your discretion based off your past experience and use of Benadryl with this.  I have provided you with Norco to use for severe pain, at nighttime.  This will cause sedation please do not use during the day.  Norco does have acetaminophen/Tylenol in it.  Please use Benadryl as needed.

## 2017-04-13 NOTE — ED Provider Notes (Signed)
[facility]    CSN: [id number] Arrival date & time: 04/12/17  1941     History   Chief Complaint Chief Complaint  Patient presents with  . Back Pain    HPI Tricia Hood is a 47 y.o. female presenting today with lower back pain that is radiating into her right leg.  States that this is been going on for the past 2 weeks, but worsened yesterday.  She is having a pins and needle sensation that goes into the lateral part of her leg.  Denies any previous issues with her back.  Denies any specific injury that initiated it worsens the pain.  Denies loss of bowel or bladder control.  Denies saddle anesthesia.  Has been taking Tylenol and ibuprofen.  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  She has been taking Benadryl and using her EpiPen while taking the ibuprofen.  States she has tolerated hydrocodone in the past. HPI  Past Medical History:  Diagnosis Date  . Asthma     Patient Active Problem List   Diagnosis Date Noted  . Pancreatitis 10/08/2014  . Cellulitis 10/07/2014  . Insect bite 10/07/2014  . Asthma 10/07/2014  . Abdominal pain 10/07/2014  . Hypokalemia 10/07/2014    Past Surgical History:  Procedure Laterality Date  . ANKLE FRACTURE SURGERY    . TONSILLECTOMY    . TUBAL LIGATION      OB History    No data available       Home Medications    Prior to Admission medications   Medication Sig Start Date End Date Taking? Authorizing Provider  albuterol (PROVENTIL HFA;VENTOLIN HFA) 108 (90 BASE) MCG/ACT inhaler Inhale 1-2 puffs into the lungs every 6 (six) hours as needed for wheezing. 10/23/12  Yes Santiago Glad, PA-C  HYDROcodone-acetaminophen (NORCO/VICODIN) 5-325 MG tablet Take 1-2 tablets by mouth every 4 (four) hours as needed for up to 3 days. 04/12/17 04/15/17  Wieters, Hallie C, PA-C  predniSONE (DELTASONE) 50 MG tablet Take 1 tablet (50 mg total) by mouth daily for 5 days. 04/12/17 04/17/17  Wieters, Junius Creamer, PA-C    Family  History Family History  Problem Relation Age of Onset  . Hypertension Mother     Social History Social History   Tobacco Use  . Smoking status: Current Every Day Smoker    Packs/day: 1.00    Types: Cigarettes  . Smokeless tobacco: Never Used  Substance Use Topics  . Alcohol use: No  . Drug use: No     Allergies   Nsaids   Review of Systems Review of Systems  Constitutional: Negative for fatigue and fever.  Eyes: Negative for visual disturbance.  Respiratory: Negative for shortness of breath.   Cardiovascular: Negative for chest pain.  Gastrointestinal: Negative for abdominal pain, nausea and vomiting.  Genitourinary: Negative for decreased urine volume and difficulty urinating.  Musculoskeletal: Positive for back pain, gait problem and myalgias. Negative for neck pain and neck stiffness.  Skin: Negative for color change, rash and wound.  Neurological: Negative for dizziness, weakness, light-headedness and numbness.     Physical Exam Triage Vital Signs ED Triage Vitals  Enc Vitals Group     BP 04/12/17 2041 (!) 153/101     Pulse Rate 04/12/17 2041 82     Resp 04/12/17 2041 16     Temp 04/12/17 2041 97.8 F (36.6 C)     Temp Source 04/12/17 2041 Oral     SpO2 04/12/17 2041 97 %  Weight --      Height --      Head Circumference --      Peak Flow --      Pain Score 04/12/17 2042 10     Pain Loc --      Pain Edu? --      Excl. in GC? --    No data found.  Updated Vital Signs BP (!) 153/101 (BP Location: Left Arm)   Pulse 82   Temp 97.8 F (36.6 C) (Oral)   Resp 16   SpO2 97%   Visual Acuity Right Eye Distance:   Left Eye Distance:   Bilateral Distance:    Right Eye Near:   Left Eye Near:    Bilateral Near:     Physical Exam  Constitutional: She appears well-developed and well-nourished. No distress.  HENT:  Head: Normocephalic and atraumatic.  Eyes: Conjunctivae are normal.  Neck: Neck supple.  Cardiovascular: Normal rate and regular  rhythm.  No murmur heard. Pulmonary/Chest: Effort normal and breath sounds normal. No respiratory distress.  Abdominal: Soft. There is no tenderness.  Musculoskeletal: She exhibits no edema, tenderness or deformity.  No obvious deformity or swelling to lower back.  Nontender to palpation over lumbar spine.  Mild tenderness to just lateral on the right side to lower lumbar area, patient unable to tolerate straight leg raise.  Neurological: She is alert.  Skin: Skin is warm and dry.  Psychiatric: She has a normal mood and affect.  Nursing note and vitals reviewed.    UC Treatments / Results  Labs (all labs ordered are listed, but only abnormal results are displayed) Labs Reviewed - No data to display  EKG  EKG Interpretation None       Radiology No results found.  Procedures Procedures (including critical care time)  Medications Ordered in UC Medications  methylPREDNISolone acetate (DEPO-MEDROL) injection 40 mg (40 mg Intramuscular Given 04/12/17 2117)  HYDROcodone-acetaminophen (NORCO/VICODIN) 5-325 MG per tablet 1 tablet (1 tablet Oral Given 04/12/17 2124)     Initial Impression / Assessment and Plan / UC Course  I have reviewed the triage vital signs and the nursing notes.  Pertinent labs & imaging results that were available during my care of the patient were reviewed by me and considered in my medical decision making (see chart for details).     Patient likely with symptoms related to radicular pain, no red flags.  Given patient allergic to NSAIDs, will refrain from ibuprofen/Toradol.  Patient states she has tolerated Tylenol well, and also has Benadryl in her system.  Patient requesting something here today for pain.  Will provide Depo-Medrol as well as 1 Norco.  Patient has driver.  Will send home with a course of prednisone.  Discussed following up with orthopedics for further evaluation and treatment. Discussed strict return precautions. Patient verbalized  understanding and is agreeable with plan.   Final Clinical Impressions(s) / UC Diagnoses   Final diagnoses:  Right-sided low back pain with left-sided sciatica, unspecified chronicity    ED Discharge Orders        Ordered    HYDROcodone-acetaminophen (NORCO/VICODIN) 5-325 MG tablet  Every 4 hours PRN     04/12/17 2109    predniSONE (DELTASONE) 50 MG tablet  Daily     04/12/17 2109       Controlled Substance Prescriptions [place] Controlled Substance Registry consulted? Yes, I have consulted the [place] Controlled Substances Registry for this patient, and feel the risk/benefit ratio today is favorable for  proceeding with this prescription for a controlled substance.  One previous prescription on record.   Lew DawesWieters, Hallie C, [place]PA-C 04/13/17 1202

## 2017-11-11 IMAGING — CT CT ABD-PELV W/ CM
2 of 5 series · 17 of 46 positions shown, 19 images · IV contrast (ISOVUE)
Comparison: 03/16/2011

CLINICAL DATA: Left-sided abdominal pain and pressure. Low back
pain. Nausea, vomiting, and diarrhea for 1 day.

EXAM:
CT ABDOMEN AND PELVIS WITH CONTRAST
TECHNIQUE: Multidetector CT imaging of the abdomen and pelvis was performed
using the standard protocol following bolus administration of
intravenous contrast.
CONTRAST:  100mL WUCKOU-ADD IOPAMIDOL (WUCKOU-ADD) INJECTION 61%

[Series 2: abd/pel with · axial · 0.84mm/px · z∈[+996,+1396]mm · 14 of 94 slices shown, 16 images]
[im 7/94  soft-tissue]
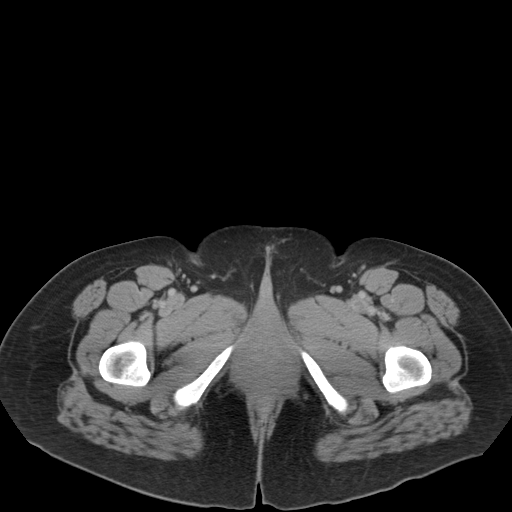
[im 7/94  bone]
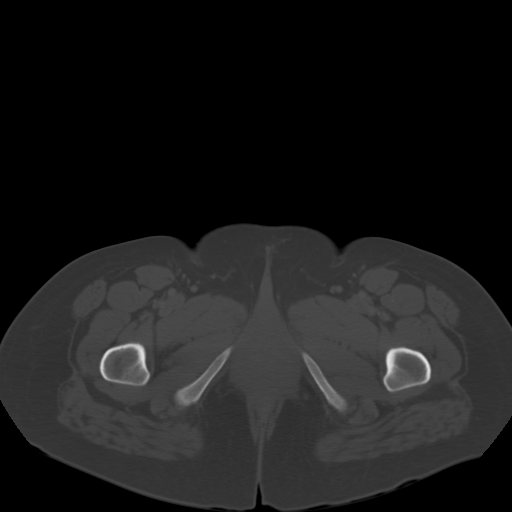
[im 13/94  soft-tissue]
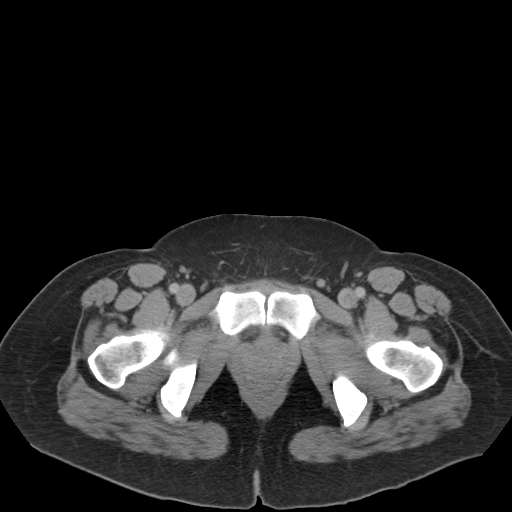
[im 19/94  soft-tissue]
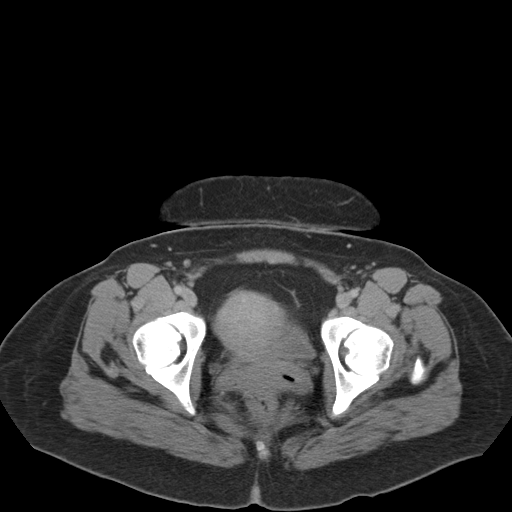
[im 25/94  soft-tissue]
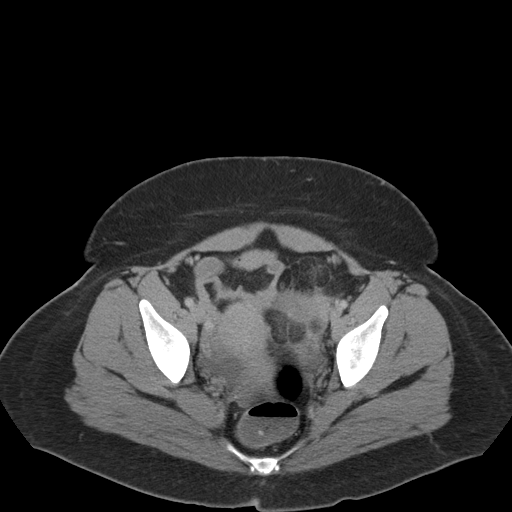
[im 32/94  soft-tissue]
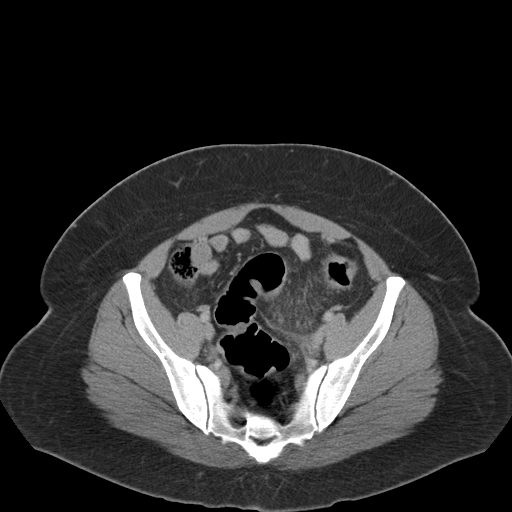
[im 38/94  soft-tissue]
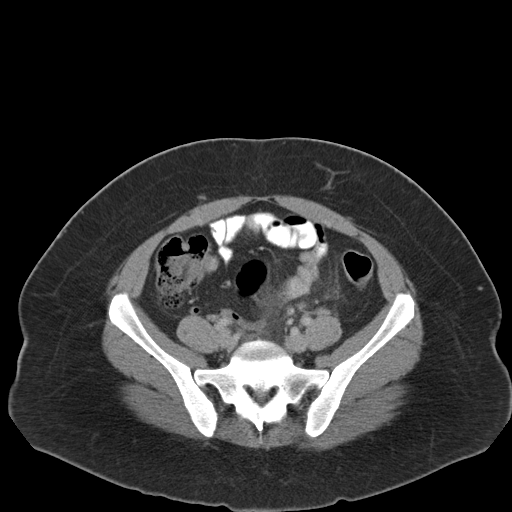
[im 44/94  soft-tissue]
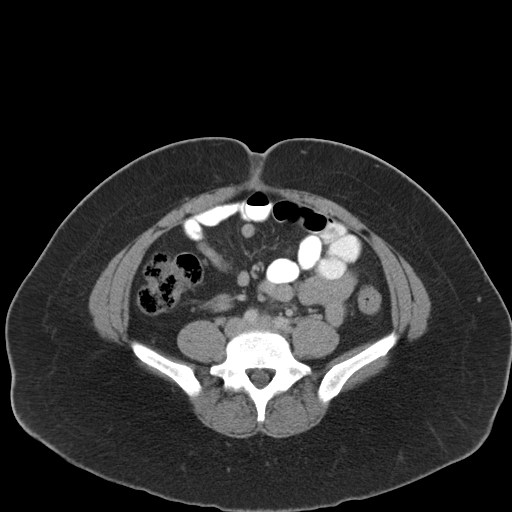
[im 50/94  soft-tissue]
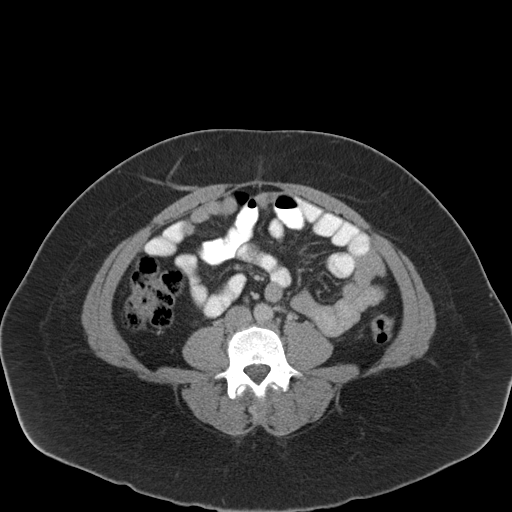
[im 56/94  soft-tissue]
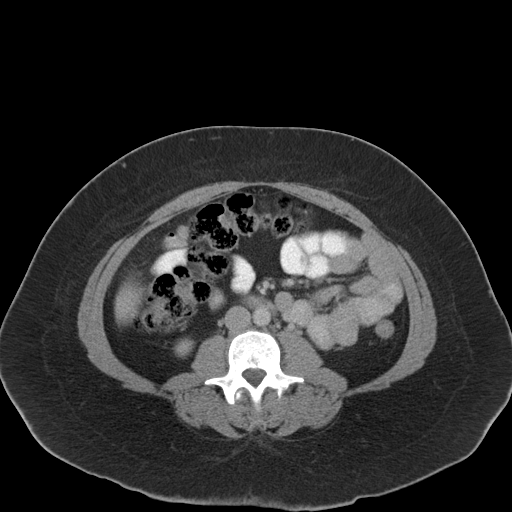
[im 56/94  bone]
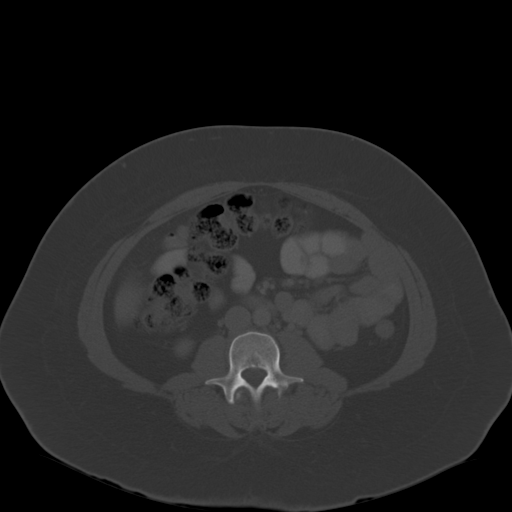
[im 63/94  soft-tissue]
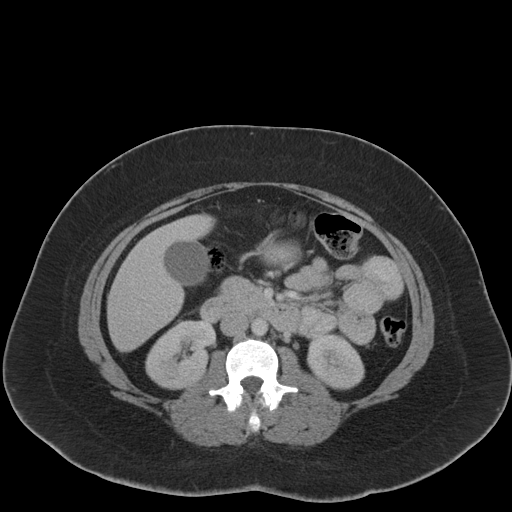
[im 69/94  soft-tissue]
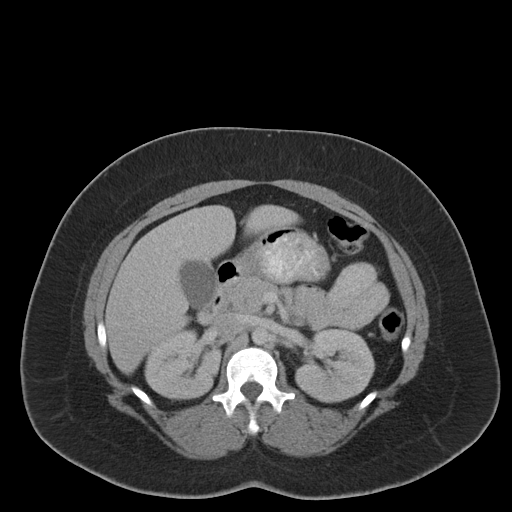
[im 75/94  soft-tissue]
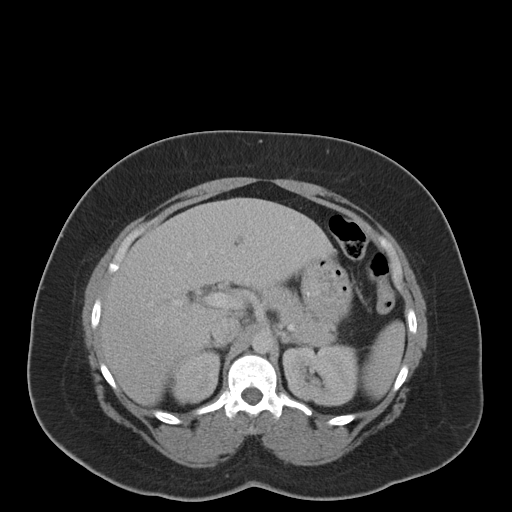
[im 81/94  soft-tissue]
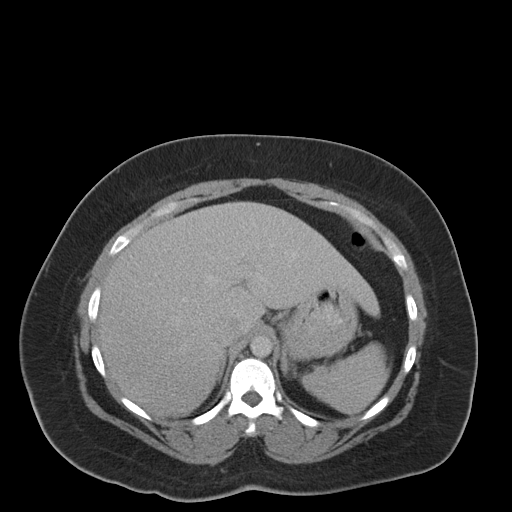
[im 87/94  soft-tissue]
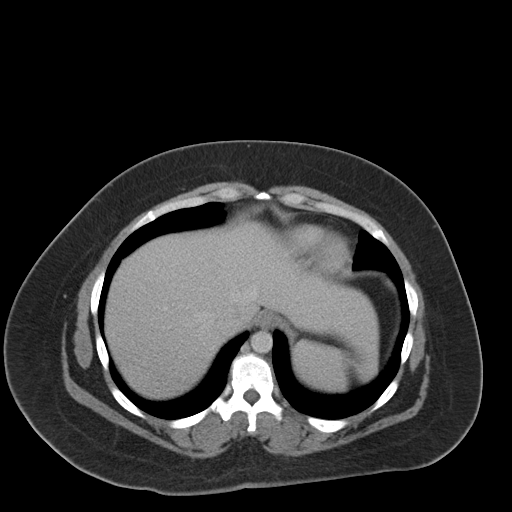

[Series 4: coronal a/|p · coronal · 0.84mm/px · 3 of 191 slices shown]
[im 64/191  soft-tissue]
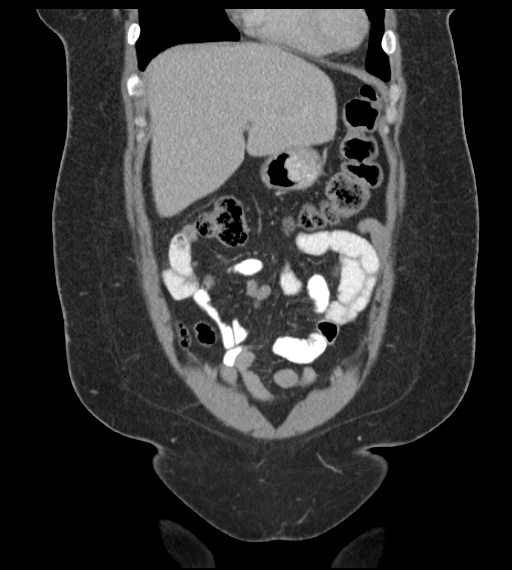
[im 85/191  soft-tissue]
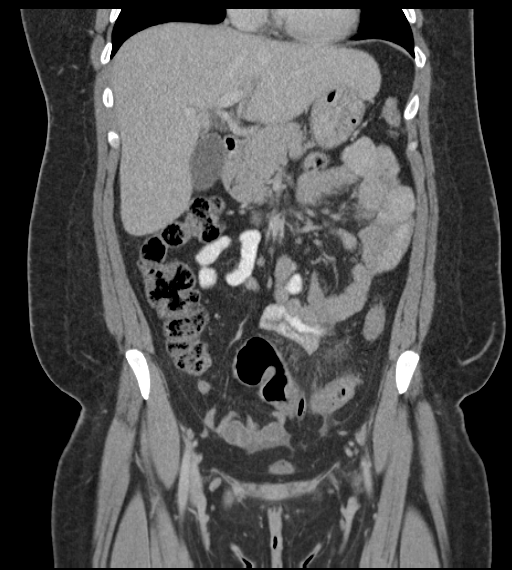
[im 106/191  soft-tissue]
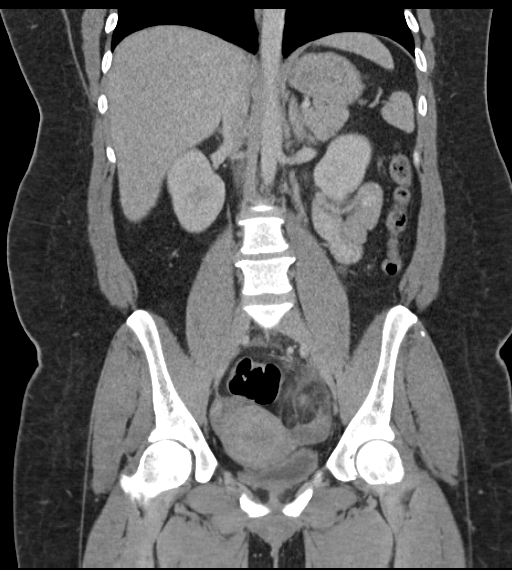

[17 of 46 positions shown; findings below may reference images not displayed]

FINDINGS: The lung bases are clear.

The liver, spleen, gallbladder, pancreas, adrenal glands, kidneys,
abdominal aorta, inferior vena cava, and retroperitoneal lymph nodes
are unremarkable. Stomach, small bowel, and colon are not abnormally
distended. Scattered stool in the colon. No free air or free fluid
in the abdomen. Small umbilical hernia containing fat.

Pelvis: The appendix is normal. Diverticulosis of the sigmoid colon.
Inflammatory infiltration around the sigmoid colon with sigmoid
colonic wall thickening. Small amount of fluid in the pelvis.
Changes are consistent with acute diverticulitis. No abscess.
Bladder is decompressed. Uterus and ovaries are not enlarged. No
destructive bone lesions.
IMPRESSION: Inflammatory changes in the left pelvis consistent with acute
diverticulitis. No abscess. Suggest follow-up after resolution of
acute process to exclude underlying colonic lesion.

## 2018-03-04 ENCOUNTER — Emergency Department (HOSPITAL_COMMUNITY)
Admission: EM | Admit: 2018-03-04 | Discharge: 2018-03-04 | Disposition: A | Discharge status: Home / Self Care | Payer: Self-pay | Attending: Emergency Medicine | Admitting: Emergency Medicine

## 2018-03-04 ENCOUNTER — Encounter (HOSPITAL_COMMUNITY): Payer: Self-pay | Admitting: Emergency Medicine

## 2018-03-04 ENCOUNTER — Other Ambulatory Visit: Payer: Self-pay

## 2018-03-04 DIAGNOSIS — F17210 Nicotine dependence, cigarettes, uncomplicated: Secondary | ICD-10-CM | POA: Insufficient documentation

## 2018-03-04 DIAGNOSIS — T7840XA Allergy, unspecified, initial encounter: Secondary | ICD-10-CM | POA: Insufficient documentation

## 2018-03-04 DIAGNOSIS — J45909 Unspecified asthma, uncomplicated: Secondary | ICD-10-CM | POA: Insufficient documentation

## 2018-03-04 MED ORDER — DIPHENHYDRAMINE HCL 25 MG PO TABS: 25.0000 mg | ORAL_TABLET | Freq: Four times a day (QID) | ORAL | 0 refills | Status: DC

## 2018-03-04 MED ORDER — PREDNISONE 10 MG PO TABS: 40.0000 mg | ORAL_TABLET | Freq: Every day | ORAL | 0 refills | Status: AC

## 2018-03-04 MED ORDER — EPINEPHRINE 0.3 MG/0.3ML IJ SOAJ: 0.3000 mg | INTRAMUSCULAR | 0 refills | Status: DC | PRN

## 2018-03-04 MED ORDER — FAMOTIDINE IN NACL 20-0.9 MG/50ML-% IV SOLN
20.0000 mg | Freq: Once | INTRAVENOUS | Status: AC
  Administered 2018-03-04: 20 mg via INTRAVENOUS
  Filled 2018-03-04: qty 50

## 2018-03-04 MED ORDER — FAMOTIDINE 20 MG PO TABS: 20.0000 mg | ORAL_TABLET | Freq: Two times a day (BID) | ORAL | 0 refills | Status: DC

## 2018-03-04 MED ORDER — DIPHENHYDRAMINE HCL 50 MG/ML IJ SOLN
50.0000 mg | Freq: Once | INTRAMUSCULAR | Status: AC
  Administered 2018-03-04: 50 mg via INTRAVENOUS
  Filled 2018-03-04: qty 1

## 2018-03-04 MED ORDER — EPINEPHRINE PF 1 MG/10ML IJ SOSY
0.3000 mg | PREFILLED_SYRINGE | Freq: Once | INTRAMUSCULAR | Status: DC
  Filled 2018-03-04: qty 10

## 2018-03-04 MED ORDER — EPINEPHRINE 0.3 MG/0.3ML IJ SOAJ: 0.3000 mg | Freq: Once | INTRAMUSCULAR | Status: DC

## 2018-03-04 NOTE — ED Notes (Signed)
Pt c/o of itching.

## 2018-03-04 NOTE — ED Triage Notes (Signed)
Pt from home with c/o allergic reaction--- pt developed oral and tongue swelling; pt has difficulty making conversation at this time.  Pt was given Benadryl, Solu-Medrol, epi and albuterol/atrovent neb treatment on scene.

## 2018-03-04 NOTE — ED Provider Notes (Signed)
[facility]-EMERGENCY DEPT Provider Note   CSN: [id number] Arrival date & time: 03/04/18  1191     History   Chief Complaint Chief Complaint  Patient presents with  . Allergic Reaction    HPI Tricia Hood is a 48 y.o. female presenting today via EMS for allergic reaction.  Patient reports that she went to bed around 3 AM last night and at that time noticed a tingling sensation to her lips.  Patient states that she awoke around 6 AM with increased swelling and difficulty breathing.  EMS was called.  In route EMS gave 50 mg Benadryl, Solu-Medrol and 0.6 mg of epinephrine.  Patient was started on DuoNeb treatment as well.  Upon ED arrival patient with edema to upper and lower lips, she is speaking in full sentences on my examination and I can visualize the back of her airway.  Patient states that she has had similar reactions before, she states that she is allergic to caffeine and believes that this was the cause of her reaction today.  She reports to drinking an energy drink prior to going to bed last night.  Additionally patient with urticaria to her arms legs and torso.  Chart review shows that patient with allergic reaction in May 2018, at that time she was prescribed an EpiPen.  Patient states that she lost this medication during her most recent move. HPI  Past Medical History:  Diagnosis Date  . Asthma     Patient Active Problem List   Diagnosis Date Noted  . Pancreatitis 10/08/2014  . Cellulitis 10/07/2014  . Insect bite 10/07/2014  . Asthma 10/07/2014  . Abdominal pain 10/07/2014  . Hypokalemia 10/07/2014    Past Surgical History:  Procedure Laterality Date  . ANKLE FRACTURE SURGERY    . TONSILLECTOMY    . TUBAL LIGATION       OB History   No obstetric history on file.      Home Medications    Prior to Admission medications   Medication Sig Start Date End Date Taking? Authorizing Provider  albuterol (PROVENTIL HFA;VENTOLIN  HFA) 108 (90 BASE) MCG/ACT inhaler Inhale 1-2 puffs into the lungs every 6 (six) hours as needed for wheezing. 10/23/12  Yes Laisure, Heather, PA-C  naproxen sodium (ALEVE) 220 MG tablet Take 440 mg by mouth as needed (headache/pain).   Yes [provider]  polyvinyl alcohol (LIQUIFILM TEARS) 1.4 % ophthalmic solution Place 1 drop into both eyes as needed for dry eyes.   Yes [provider]  diphenhydrAMINE (BENADRYL) 25 MG tablet Take 1 tablet (25 mg total) by mouth every 6 (six) hours. 03/04/18   Harlene Salts A, PA-C  EPINEPHrine (EPIPEN 2-PAK) 0.3 mg/0.3 mL IJ SOAJ injection Inject 0.3 mLs (0.3 mg total) into the muscle as needed for anaphylaxis. 03/04/18   Harlene Salts A, PA-C  famotidine (PEPCID) 20 MG tablet Take 1 tablet (20 mg total) by mouth 2 (two) times daily. 03/04/18   Harlene Salts A, PA-C  predniSONE (DELTASONE) 10 MG tablet Take 4 tablets (40 mg total) by mouth daily for 4 days. 03/04/18 03/08/18  Bill Salinas, PA-C    Family History Family History  Problem Relation Age of Onset  . Hypertension Mother     Social History Social History   Tobacco Use  . Smoking status: Current Every Day Smoker    Packs/day: 1.00    Types: Cigarettes  . Smokeless tobacco: Never Used  Substance Use Topics  . Alcohol use:  No  . Drug use: No     Allergies   Nsaids and Other   Review of Systems Review of Systems  Constitutional: Negative.  Negative for chills and fever.  HENT: Positive for facial swelling (Improving). Negative for rhinorrhea and sore throat.   Respiratory: Positive for shortness of breath (Improved). Negative for cough.   Cardiovascular: Negative.  Negative for chest pain.  Gastrointestinal: Negative.  Negative for abdominal pain, diarrhea, nausea and vomiting.  Skin: Positive for rash (urticaria).  Neurological: Negative.  Negative for dizziness, syncope and weakness.  All other systems reviewed and are negative.  Physical Exam Updated  Vital Signs BP 117/77 (BP Location: Right Arm)   Pulse 74   Temp (!) 97.5 F (36.4 C) (Oral)   Resp (!) 25   SpO2 98%   Physical Exam Constitutional:      General: She is not in acute distress.    Appearance: She is well-developed. She is obese. She is not toxic-appearing.  HENT:     Head: Normocephalic and atraumatic.     Right Ear: External ear normal.     Left Ear: External ear normal.     Nose: Nose normal.     Mouth/Throat:     Lips: Pink.     Mouth: Mucous membranes are moist.     Pharynx: Oropharynx is clear. Uvula midline. No pharyngeal swelling or uvula swelling.      Comments: Swelling of the upper and lower lips Eyes:     General: Vision grossly intact. Gaze aligned appropriately.     Extraocular Movements: Extraocular movements intact.     Conjunctiva/sclera: Conjunctivae normal.     Pupils: Pupils are equal, round, and reactive to light.  Neck:     Musculoskeletal: Full passive range of motion without pain, normal range of motion and neck supple.     Trachea: Trachea and phonation normal. No tracheal deviation.  Cardiovascular:     Rate and Rhythm: Normal rate and regular rhythm.     Heart sounds: Normal heart sounds.  Pulmonary:     Effort: Pulmonary effort is normal. No respiratory distress.     Breath sounds: Normal breath sounds and air entry.  Abdominal:     Palpations: Abdomen is soft.     Tenderness: There is no abdominal tenderness. There is no guarding or rebound.  Musculoskeletal: Normal range of motion.  Skin:    General: Skin is warm and dry.     Findings: Rash present. Rash is urticarial.  Neurological:     Mental Status: She is alert and oriented to person, place, and time.  Psychiatric:        Behavior: Behavior normal.      ED Treatments / Results  Labs (all labs ordered are listed, but only abnormal results are displayed) Labs Reviewed - No data to display  EKG None  Radiology No results found.  Procedures Procedures  (including critical care time)  Medications Ordered in ED Medications  famotidine (PEPCID) IVPB 20 mg premix (0 mg Intravenous Stopped 03/04/18 1110)  diphenhydrAMINE (BENADRYL) injection 50 mg (50 mg Intravenous Given 03/04/18 1113)     Initial Impression / Assessment and Plan / ED Course  I have reviewed the triage vital signs and the nursing notes.  Pertinent labs & imaging results that were available during my care of the patient were reviewed by me and considered in my medical decision making (see chart for details).    On my initial evaluation patient with edema  to the lips however seems somewhat improved compared to prior, she is speaking full sentences and airway is intact.  7:10 AM: Reassessment, patient states that she is feeling improved but still feels as if she is "wearing a mask" on her lower lip.  Swelling is greatly improved compared to initial evaluation.  Airway is patent and she is speaking in full sentences. --------------- 7:33 AM: Reassessment, patient is resting comfortably and in no acute distress with boyfriend at bedside.  Swelling has greatly improved, minimal lower lip edema at this time.  Patient states that she is breathing normally and is feeling better.  Airway patent, lung sounds clear, Urticaria improving. ----------------- 8:25 AM: Reassessment, patient resting comfortably and in no acute distress.  Swelling exam is completely improved and patient states that she is feeling well.  Airways patent, breath sounds are normal.  Patient states that her only concern at this time is itching.  Pepcid ordered. ------------------ Reassessed, resting comfortably no acute distress, still with itching Pepcid bolus underway. -------------- 11:00 AM: Patient reassessment, resting comfortably no acute distress.  She states that she feels that her face is again become more swollen and she is having tightness in her throat.  Airway patent, lungs clear to auscultation.   Rediscussed case with Dr. Patria Mane, will be of additional 50 mg of Benadryl and observe. ----------------- Patient symptoms have continued to resolve here in the emergency department.  She has been observed for approximately 9 hours at this time without recurrence.  Patient still endorses some swelling around her lips but states that this is not worsened and feels much improved since she arrived.  Patient's airway has remained patent and lungs are clear throughout visit.  Patient states that she is feeling well and is requesting discharge.  ------------------ At discharge patient is well-appearing and in no acute distress.  Swelling is almost completely resolved and she is without complaint.  Afebrile, not tachycardic, not hypotensive, SPO2 98% on room air, airway pain and lungs clear.  Patient to be prescribed prednisone, Benadryl, Pepcid and EpiPen's.  She has been given instruction and return precautions regarding all of these medications.  Patient has been given referral to allergist for further evaluation, she believes it is caffeine and caused her reaction today however is not 100% sure.  I have encouraged patient to avoid possible causes of allergic reaction today.  I also encouraged her to follow-up with her primary care provider.  At this time there does not appear to be any evidence of an acute emergency medical condition and the patient appears stable for discharge with appropriate outpatient follow up. Diagnosis was discussed with patient who verbalizes understanding of care plan and is agreeable to discharge. I have discussed return precautions with patient and fiance who verbalize understanding of return precautions. Patient strongly encouraged to follow-up with their PCP within one week. All questions answered.  Patient seen and evaluated by Dr. Patria Mane during this visit who agrees that patient may be discharged at this time with outpatient follow-up.  Note: Portions of this report may have  been transcribed using voice recognition software. Every effort was made to ensure accuracy; however, inadvertent computerized transcription errors may still be present.  Final Clinical Impressions(s) / ED Diagnoses   Final diagnoses:  Allergic reaction, initial encounter    ED Discharge Orders         Ordered    EPINEPHrine (EPIPEN 2-PAK) 0.3 mg/0.3 mL IJ SOAJ injection  As needed     03/04/18 1555  diphenhydrAMINE (BENADRYL) 25 MG tablet  Every 6 hours     03/04/18 1555    famotidine (PEPCID) 20 MG tablet  2 times daily     03/04/18 1555    predniSONE (DELTASONE) 10 MG tablet  Daily     03/04/18 1555           Elizabeth PalauMorelli, Gissele Narducci A, PA-C 03/04/18 1608    Azalia Bilisampos, Kevin, MD 03/04/18 1711

## 2018-03-04 NOTE — Discharge Instructions (Signed)
You have been diagnosed today with Allergic Reaction.  At this time there does not appear to be the presence of an emergent medical condition, however there is always the potential for conditions to change. Please read and follow the below instructions.  Please return to the Emergency Department immediately for any new or worsening symptoms. Please be sure to follow up with your Primary Care Provider within one week regarding your visit today; please call their office to schedule an appointment even if you are feeling better for a follow-up visit. Please continue take the medications Benadryl, prednisone, Pepcid as prescribed to help with your symptoms over the next few days. You may use the epinephrine pen as prescribed if your symptoms of allergic reaction or recur.  You must call 911 and come to the emergency department if you use this medication as epinephrine will wear off and your symptoms will recur. You may call the [facility]ist at [facility] for further evaluation.  Call them today or tomorrow to schedule appointment for next week.  Get help right away if: You develop symptoms of an allergic reaction. You may notice them soon after you are exposed to a substance. Symptoms may include: Flushed skin. Hives. Swelling of the eyes, lips, face, mouth, tongue, or throat. Difficulty breathing, speaking, or swallowing. Wheezing. Dizziness or light-headedness. Fainting. Pain or cramping in the abdomen. Vomiting. Diarrhea.  Please read the additional information packets attached to your discharge summary.  Do not take your medicine if  develop an itchy rash, swelling in your mouth or lips, or difficulty breathing.

## 2018-04-14 ENCOUNTER — Other Ambulatory Visit: Payer: Self-pay

## 2018-04-14 ENCOUNTER — Emergency Department (HOSPITAL_COMMUNITY)
Admission: EM | Admit: 2018-04-14 | Discharge: 2018-04-14 | Disposition: A | Discharge status: Home / Self Care | Payer: Medicaid Other | Attending: Emergency Medicine | Admitting: Emergency Medicine

## 2018-04-14 ENCOUNTER — Emergency Department (HOSPITAL_COMMUNITY): Payer: Medicaid Other

## 2018-04-14 ENCOUNTER — Encounter (HOSPITAL_COMMUNITY): Payer: Self-pay | Admitting: Emergency Medicine

## 2018-04-14 DIAGNOSIS — J111 Influenza due to unidentified influenza virus with other respiratory manifestations: Secondary | ICD-10-CM | POA: Insufficient documentation

## 2018-04-14 DIAGNOSIS — J0100 Acute maxillary sinusitis, unspecified: Secondary | ICD-10-CM | POA: Insufficient documentation

## 2018-04-14 DIAGNOSIS — M791 Myalgia, unspecified site: Secondary | ICD-10-CM | POA: Insufficient documentation

## 2018-04-14 DIAGNOSIS — Z79899 Other long term (current) drug therapy: Secondary | ICD-10-CM | POA: Insufficient documentation

## 2018-04-14 DIAGNOSIS — J45909 Unspecified asthma, uncomplicated: Secondary | ICD-10-CM | POA: Insufficient documentation

## 2018-04-14 DIAGNOSIS — R110 Nausea: Secondary | ICD-10-CM | POA: Insufficient documentation

## 2018-04-14 DIAGNOSIS — F17210 Nicotine dependence, cigarettes, uncomplicated: Secondary | ICD-10-CM | POA: Insufficient documentation

## 2018-04-14 DIAGNOSIS — R590 Localized enlarged lymph nodes: Secondary | ICD-10-CM | POA: Insufficient documentation

## 2018-04-14 DIAGNOSIS — R0981 Nasal congestion: Secondary | ICD-10-CM | POA: Insufficient documentation

## 2018-04-14 DIAGNOSIS — R05 Cough: Secondary | ICD-10-CM | POA: Insufficient documentation

## 2018-04-14 DIAGNOSIS — R0789 Other chest pain: Secondary | ICD-10-CM | POA: Insufficient documentation

## 2018-04-14 DIAGNOSIS — R69 Illness, unspecified: Secondary | ICD-10-CM

## 2018-04-14 LAB — INFLUENZA PANEL BY PCR (TYPE A & B)
Influenza A By PCR: NEGATIVE
Influenza B By PCR: NEGATIVE

## 2018-04-14 MED ORDER — PREDNISONE 20 MG PO TABS
60.0000 mg | ORAL_TABLET | Freq: Once | ORAL | Status: AC
  Administered 2018-04-14: 60 mg via ORAL
  Filled 2018-04-14: qty 3

## 2018-04-14 MED ORDER — ACETAMINOPHEN 500 MG PO TABS
1000.0000 mg | ORAL_TABLET | Freq: Once | ORAL | Status: AC
  Administered 2018-04-14: 1000 mg via ORAL
  Filled 2018-04-14: qty 2

## 2018-04-14 MED ORDER — PREDNISONE 10 MG PO TABS: 40.0000 mg | ORAL_TABLET | Freq: Every day | ORAL | 0 refills | Status: AC

## 2018-04-14 MED ORDER — ACETAMINOPHEN 500 MG PO TABS: 500.0000 mg | ORAL_TABLET | Freq: Four times a day (QID) | ORAL | 0 refills | Status: DC | PRN

## 2018-04-14 MED ORDER — AMOXICILLIN 500 MG PO CAPS: 500.0000 mg | ORAL_CAPSULE | Freq: Three times a day (TID) | ORAL | 0 refills | Status: AC

## 2018-04-14 MED ORDER — ALBUTEROL SULFATE HFA 108 (90 BASE) MCG/ACT IN AERS
1.0000 | INHALATION_SPRAY | Freq: Once | RESPIRATORY_TRACT | Status: AC
  Administered 2018-04-14: 1 via RESPIRATORY_TRACT
  Filled 2018-04-14: qty 6.7

## 2018-04-14 NOTE — Discharge Instructions (Signed)
Please take all of your antibiotics until finished!   You may develop abdominal discomfort or diarrhea from the antibiotic.  You may help offset this with probiotics which you can buy or get in yogurt. Do not eat  or take the probiotics until 2 hours after your antibiotic.   Use your albuterol inhaler 1 to 2 puffs every 4-6 hours as needed for shortness of breath.  Take prednisone as prescribed.  Take 500 to 1000 mg of Tylenol every 6 hours or so as needed for fevers or pain.  Do not exceed 4000 mg of Tylenol daily.  Take Flonase for nasal congestion.  You can also use other over-the-counter medications for your symptoms.  Drink plenty of fluids and get plenty of rest.  Please isolate yourself at home for the next 2 weeks. You could have had an exposure to Coronavirus.  Wash your hands frequently.  Avoid any contact with old people, young people, or people with medical problems.  Do not leave the house during this time.  Please return to the emergency department immediately if any concerning signs or symptoms develop such as worsening shortness of breath, high fevers, severe chest pains, persistent cough.

## 2018-04-14 NOTE — ED Triage Notes (Signed)
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 Pt states since last Friday having body aches, cough, congestion, chills. Adds nausea and diarrhea since Saturday.

## 2018-04-14 NOTE — ED Provider Notes (Signed)
[facility]-EMERGENCY DEPT Provider Note   CSN: [id number] Arrival date & time: 04/14/18  1615    History   Chief Complaint Chief Complaint  Patient presents with  . Cough  . Nasal Congestion  . Generalized Body Aches  . Diarrhea    HPI Tricia Hood is a 48 y.o. female with history of asthma, pancreatitis presents for evaluation of acute onset, progressively worsening sinus pressure, nausea, diarrhea, generalized myalgias for 1.5 weeks.  She reports that in the last 4 days her symptoms have worsened and she has developed a cough intermittently productive of yellow sputum, chest tightness and wheezing consistent with an asthma exacerbation, sore throat, nasal congestion, lymphadenopathy.  She denies fevers or chills.  Ran out of her albuterol inhaler.  Has been drinking water but otherwise has not taken anything for her symptoms.  She does report that 2 weeks ago a coworker returned from a cruise which included visit to [place] who was sick with similar symptoms but "could not afford to take off work ". Se does not know if the coworker tested positive for COVID-19. She reports that she developed symptoms a few days later.  She is a current smoker of approximately 1/4 pack of cigarettes daily.    The history is provided by the patient.    Past Medical History:  Diagnosis Date  . Asthma     Patient Active Problem List   Diagnosis Date Noted  . Pancreatitis 10/08/2014  . Cellulitis 10/07/2014  . Insect bite 10/07/2014  . Asthma 10/07/2014  . Abdominal pain 10/07/2014  . Hypokalemia 10/07/2014    Past Surgical History:  Procedure Laterality Date  . ANKLE FRACTURE SURGERY    . TONSILLECTOMY    . TUBAL LIGATION       OB History   No obstetric history on file.      Home Medications    Prior to Admission medications   Medication Sig Start Date End Date Taking? Authorizing Provider  acetaminophen (TYLENOL) 500 MG tablet Take 1 tablet (500 mg  total) by mouth every 6 (six) hours as needed. 04/14/18   Weylyn Ricciuti A, PA-C  albuterol (PROVENTIL HFA;VENTOLIN HFA) 108 (90 BASE) MCG/ACT inhaler Inhale 1-2 puffs into the lungs every 6 (six) hours as needed for wheezing. 10/23/12   Santiago Glad, PA-C  amoxicillin (AMOXIL) 500 MG capsule Take 1 capsule (500 mg total) by mouth 3 (three) times daily for 7 days. 04/14/18 04/21/18  Michela Pitcher A, PA-C  diphenhydrAMINE (BENADRYL) 25 MG tablet Take 1 tablet (25 mg total) by mouth every 6 (six) hours. 03/04/18   Harlene Salts A, PA-C  EPINEPHrine (EPIPEN 2-PAK) 0.3 mg/0.3 mL IJ SOAJ injection Inject 0.3 mLs (0.3 mg total) into the muscle as needed for anaphylaxis. 03/04/18   Harlene Salts A, PA-C  famotidine (PEPCID) 20 MG tablet Take 1 tablet (20 mg total) by mouth 2 (two) times daily. 03/04/18   Harlene Salts A, PA-C  naproxen sodium (ALEVE) 220 MG tablet Take 440 mg by mouth as needed (headache/pain).    [provider]  polyvinyl alcohol (LIQUIFILM TEARS) 1.4 % ophthalmic solution Place 1 drop into both eyes as needed for dry eyes.    [provider]  predniSONE (DELTASONE) 10 MG tablet Take 4 tablets (40 mg total) by mouth daily with breakfast for 4 days. 04/14/18 04/18/18  Jeanie Sewer, PA-C    Family History Family History  Problem Relation Age of Onset  . Hypertension Mother  Social History Social History   Tobacco Use  . Smoking status: Current Every Day Smoker    Packs/day: 1.00    Types: Cigarettes  . Smokeless tobacco: Never Used  Substance Use Topics  . Alcohol use: No  . Drug use: No     Allergies   Nsaids and Other   Review of Systems Review of Systems  Constitutional: Negative for chills and fever.  HENT: Positive for congestion and sore throat. Negative for voice change.   Respiratory: Positive for cough and chest tightness. Negative for shortness of breath.   Cardiovascular: Negative for chest pain.  Gastrointestinal: Positive for diarrhea  and nausea. Negative for abdominal pain and vomiting.  Musculoskeletal: Positive for myalgias. Negative for neck stiffness.  All other systems reviewed and are negative.    Physical Exam Updated Vital Signs BP (!) 144/101 (BP Location: Right Arm)   Pulse 82   Temp 98.2 F (36.8 C) (Oral)   Resp 18   LMP 04/13/2018   SpO2 100%   Physical Exam Vitals signs and nursing note reviewed.  Constitutional:      General: She is not in acute distress.    Appearance: She is well-developed.  HENT:     Head: Normocephalic and atraumatic.     Right Ear: Tympanic membrane and ear canal normal.     Left Ear: Tympanic membrane and ear canal normal.     Nose: Congestion present.     Comments: Bilateral frontal and maxillary sinus tenderness.  Mucosal edema noted bilaterally.  Posterior pharynx with no erythema, tonsillar hypertrophy, exudates, or uvular deviation.  No trismus or sublingual abnormalities    Mouth/Throat:     Mouth: Mucous membranes are moist.     Pharynx: No oropharyngeal exudate or posterior oropharyngeal erythema.  Eyes:     General:        Right eye: No discharge.        Left eye: No discharge.     Extraocular Movements: Extraocular movements intact.     Conjunctiva/sclera: Conjunctivae normal.     Pupils: Pupils are equal, round, and reactive to light.  Neck:     Musculoskeletal: Normal range of motion and neck supple. No neck rigidity or muscular tenderness.     Vascular: No JVD.     Trachea: No tracheal deviation.  Cardiovascular:     Rate and Rhythm: Normal rate and regular rhythm.     Pulses: Normal pulses.     Heart sounds: Normal heart sounds.  Pulmonary:     Effort: Pulmonary effort is normal.     Breath sounds: Wheezing present.     Comments: Scattered expiratory wheezing.  Diffuse tenderness to palpation of the chest wall with no deformity, crepitus, ecchymosis, or flail segment Chest:     Chest wall: Tenderness present.  Abdominal:     General: Abdomen  is flat. There is no distension.     Tenderness: There is no abdominal tenderness. There is no guarding or rebound.  Musculoskeletal: Normal range of motion.  Lymphadenopathy:     Cervical: Cervical adenopathy present.  Skin:    General: Skin is warm and dry.     Findings: No erythema.  Neurological:     General: No focal deficit present.     Mental Status: She is alert.  Psychiatric:        Behavior: Behavior normal.      ED Treatments / Results  Labs (all labs ordered are listed, but only abnormal results are displayed)  Labs Reviewed  INFLUENZA PANEL BY PCR (TYPE A & B)    EKG None  Radiology Dg Chest 2 View  Result Date: 04/14/2018 CLINICAL DATA:  Body aches with cough and congestion. EXAM: CHEST - 2 VIEW COMPARISON:  10/23/2012. FINDINGS: The heart size and mediastinal contours are within normal limits. Both lungs are clear. The visualized skeletal structures are unremarkable. IMPRESSION: No active cardiopulmonary disease. Electronically Signed   By: Elsie Stain M.D.   On: 04/14/2018 17:03    Procedures Procedures (including critical care time)  Medications Ordered in ED Medications  albuterol (PROVENTIL HFA;VENTOLIN HFA) 108 (90 Base) MCG/ACT inhaler 1 puff (has no administration in time range)  predniSONE (DELTASONE) tablet 60 mg (has no administration in time range)  acetaminophen (TYLENOL) tablet 1,000 mg (has no administration in time range)     Initial Impression / Assessment and Plan / ED Course  I have reviewed the triage vital signs and the nursing notes.  Pertinent labs & imaging results that were available during my care of the patient were reviewed by me and considered in my medical decision making (see chart for details).        Patient presenting with nasal congestion, myalgias, sore throat, cough, chest tightness for 1.5 weeks.  She is afebrile, vital signs are stable.  She is nontoxic in appearance.  Chest x-ray shows no evidence of acute  cardiopulmonary abnormalities with no evidence of pneumonia or pleural effusion.  She does have diffuse expiratory wheezing consistent with asthma exacerbation however SPO2 saturations are stable.  Will give refill of albuterol inhaler and prednisone burst. Influenza negative.  She was recently in contact with somebody who took a cruise to [place] and had similar symptoms but is unsure if the coworker tested positive for COVID-19 and she herself has not had any high risk travel so she has not had any confirmed exposure.  Given she is overall well-appearing with stable vital signs, she would not require admission and this does not require COVID-19 testing for current hospital in [place] infectious disease control requirements.  I did recommend self quarantine for the next 2 weeks.  She has had sinus pressure for greater than 7 days and so may benefit from a short course of antibiotics.  Discussed symptomatic treatment, frequent handwashing, appropriate isolation measures and hygiene.  Discussed strict ED return precautions. Pt verbalized understanding of and agreement with plan and is safe for discharge home at this time.   Final Clinical Impressions(s) / ED Diagnoses   Final diagnoses:  Acute maxillary sinusitis, recurrence not specified  Influenza-like illness    ED Discharge Orders         Ordered    predniSONE (DELTASONE) 10 MG tablet  Daily with breakfast     04/14/18 1800    acetaminophen (TYLENOL) 500 MG tablet  Every 6 hours PRN     04/14/18 1800    amoxicillin (AMOXIL) 500 MG capsule  3 times daily     04/14/18 1819           Bennye Alm 04/14/18 Vashti Hey, MD 04/15/18 [phone]

## 2018-05-18 ENCOUNTER — Emergency Department (HOSPITAL_COMMUNITY)
Admission: EM | Admit: 2018-05-18 | Discharge: 2018-05-18 | Disposition: A | Discharge status: Home / Self Care | Payer: Self-pay | Attending: Emergency Medicine | Admitting: Emergency Medicine

## 2018-05-18 ENCOUNTER — Other Ambulatory Visit: Payer: Self-pay

## 2018-05-18 DIAGNOSIS — T50901A Poisoning by unspecified drugs, medicaments and biological substances, accidental (unintentional), initial encounter: Secondary | ICD-10-CM | POA: Insufficient documentation

## 2018-05-18 DIAGNOSIS — R402422 Glasgow coma scale score 9-12, at arrival to emergency department: Secondary | ICD-10-CM | POA: Insufficient documentation

## 2018-05-18 LAB — COMPREHENSIVE METABOLIC PANEL
ALT: 18 U/L (ref 0–44)
AST: 25 U/L (ref 15–41)
Albumin: 3.9 g/dL (ref 3.5–5.0)
Alkaline Phosphatase: 47 U/L (ref 38–126)
Anion gap: 14 (ref 5–15)
BUN: 11 mg/dL (ref 6–20)
CO2: 21 mmol/L — ABNORMAL LOW (ref 22–32)
Calcium: 8.3 mg/dL — ABNORMAL LOW (ref 8.9–10.3)
Chloride: 100 mmol/L (ref 98–111)
Creatinine, Ser: 0.75 mg/dL (ref 0.44–1.00)
GFR calc Af Amer: >60 mL/min (ref >60)
GFR calc non Af Amer: >60 mL/min (ref >60)
Glucose, Bld: 211 mg/dL — ABNORMAL HIGH (ref 70–99)
Potassium: 3.1 mmol/L — ABNORMAL LOW (ref 3.5–5.1)
Sodium: 135 mmol/L (ref 135–145)
Total Bilirubin: 0.4 mg/dL (ref 0.3–1.2)
Total Protein: 7 g/dL (ref 6.5–8.1)

## 2018-05-18 LAB — CBC WITH DIFFERENTIAL/PLATELET
Abs Immature Granulocytes: 0.01 10*3/uL (ref 0.00–0.07)
Basophils Absolute: 0 10*3/uL (ref 0.0–0.1)
Basophils Relative: 0 %
Eosinophils Absolute: 0.3 10*3/uL (ref 0.0–0.5)
Eosinophils Relative: 4 %
HCT: 41.9 % (ref 36.0–46.0)
Hemoglobin: 13 g/dL (ref 12.0–15.0)
Immature Granulocytes: 0 %
Lymphocytes Relative: 49 %
Lymphs Abs: 3.4 10*3/uL (ref 0.7–4.0)
MCH: 30.5 pg (ref 26.0–34.0)
MCHC: 31 g/dL (ref 30.0–36.0)
MCV: 98.4 fL (ref 80.0–100.0)
Monocytes Absolute: 0.4 10*3/uL (ref 0.1–1.0)
Monocytes Relative: 6 %
Neutro Abs: 2.8 10*3/uL (ref 1.7–7.7)
Neutrophils Relative %: 41 %
Platelets: 314 10*3/uL (ref 150–400)
RBC: 4.26 MIL/uL (ref 3.87–5.11)
RDW: 13 % (ref 11.5–15.5)
WBC: 6.9 10*3/uL (ref 4.0–10.5)
nRBC: 0 % (ref 0.0–0.2)

## 2018-05-18 LAB — SALICYLATE LEVEL: Salicylate Lvl: <7 mg/dL (ref 2.8–30.0)

## 2018-05-18 LAB — CBG MONITORING, ED: Glucose-Capillary: 165 mg/dL — ABNORMAL HIGH (ref 70–99)

## 2018-05-18 LAB — ETHANOL: Alcohol, Ethyl (B): 70 mg/dL — ABNORMAL HIGH (ref <10)

## 2018-05-18 LAB — ACETAMINOPHEN LEVEL: Acetaminophen (Tylenol), Serum: <10 ug/mL — ABNORMAL LOW (ref 10–30)

## 2018-05-18 MED ORDER — NALOXONE HCL 0.4 MG/ML IJ SOLN: 0.4000 mg | INTRAMUSCULAR | Status: DC | PRN

## 2018-05-18 MED ORDER — SODIUM CHLORIDE 0.9 % IV SOLN
INTRAVENOUS | Status: DC
  Administered 2018-05-18: 06:00:00 via INTRAVENOUS

## 2018-05-18 MED ORDER — ONDANSETRON 8 MG PO TBDP: 8.0000 mg | ORAL_TABLET | Freq: Three times a day (TID) | ORAL | 0 refills | Status: DC | PRN

## 2018-05-18 MED ORDER — ONDANSETRON HCL 4 MG/2ML IJ SOLN
INTRAMUSCULAR | Status: AC
  Filled 2018-05-18: qty 2

## 2018-05-18 MED ORDER — SODIUM CHLORIDE 0.9 % IV BOLUS
500.0000 mL | Freq: Once | INTRAVENOUS | Status: AC
  Administered 2018-05-18: 500 mL via INTRAVENOUS

## 2018-05-18 MED ORDER — ONDANSETRON HCL 4 MG/2ML IJ SOLN
4.0000 mg | Freq: Once | INTRAMUSCULAR | Status: AC
  Administered 2018-05-18: 09:00:00 4 mg via INTRAVENOUS

## 2018-05-18 MED ORDER — SODIUM CHLORIDE 0.9 % IV BOLUS
1000.0000 mL | Freq: Once | INTRAVENOUS | Status: AC
  Administered 2018-05-18: 1000 mL via INTRAVENOUS

## 2018-05-18 NOTE — ED Notes (Signed)
Pt vomiting.  MD notified. Will give zofran.

## 2018-05-18 NOTE — ED Triage Notes (Signed)
Pt arrives via EMS from home. There was a call out to the residence for cardiac arrest (for the other person at the residence). On arrival to the scene, the pt was slumped over on the couch, unresponsive and apenic. She was given 2 narcan IM with minimal response, then given additional 2mg  in IV in the 20 gauge in the left ac, with some response. She has been 100% on NRB. Last vitals 136/82, hr 90, cbg 298. Responds to sternal rub.

## 2018-05-18 NOTE — ED Notes (Signed)
Pt is responsive to voice. Only c/o chest pain at this time.

## 2018-05-18 NOTE — ED Provider Notes (Addendum)
[facility] EMERGENCY DEPARTMENT Provider Note  CSN: [id number] Arrival date & time:    Chief Complaint(s) Unresponsive ED Triage Notes  Montey Hora, RN (Registered Nurse) . [facility] . Marland Kitchen 05/18/2018 3:45 AM . . Signed    Pt arrives via EMS from home. There was a call out to the residence for cardiac arrest (for the other person at the residence). On arrival to the scene, the pt was slumped over on the couch, unresponsive and apenic. She was given 2 narcan IM with minimal response, then given additional 2mg  in IV in the 20 gauge in the left ac, with some response. She has been 100% on NRB. Last vitals 136/82, hr 90, cbg 298. Responds to sternal rub.       HPI Tricia Hood is a 48 y.o. female who presents by EMS after being found unresponsive at home.  As noted above, EMS was called to the residence for cardiac arrest of another person in the home.  When they arrived they found this patient slumped over the couch, unresponsive and apneic.  She responded to 4 mg of Narcan.  Since then she is remained hemodynamically stable and satting well on nonrebreather.  On arrival patient was minimally responsive but slowly regained consciousness.  She was breathing on around and maintaining her airway.    Remainder of history, ROS, and physical exam limited due to patient's condition (unresponsive). Additional information was obtained from EMS.   Level V Caveat.    HPI  Past Medical History No past medical history on file. There are no active problems to display for this patient.  Home Medication(s) Prior to Admission medications   Not on File                                                                                                                                    Past Surgical History  The histories are not reviewed yet. Please review them in the "History" navigator section and refresh this SmartLink. Family History No family history on file.  Social  History Social History   Tobacco Use  . Smoking status: Not on file  Substance Use Topics  . Alcohol use: Not on file  . Drug use: Not on file   Allergies Patient has no allergy information on record.  Review of Systems Review of Systems  Unable to perform ROS: Patient unresponsive    Physical Exam Vital Signs  I have reviewed the triage vital signs BP 128/81 (BP Location: Right Arm)   Pulse 82   Temp (!) 96.3 F (35.7 C) (Rectal)   Resp 13   SpO2 100%   Physical Exam Vitals signs reviewed.  Constitutional:      General: She is not in acute distress.    Appearance: She is well-developed. She is not diaphoretic.  HENT:     Head: Normocephalic and atraumatic.     Comments:  Nasal airway in place    Nose: Nose normal.  Eyes:     General: No scleral icterus.       Right eye: No discharge.        Left eye: No discharge.     Conjunctiva/sclera: Conjunctivae normal.     Pupils: Pupils are equal, round, and reactive to light.  Neck:     Musculoskeletal: Normal range of motion and neck supple.  Cardiovascular:     Rate and Rhythm: Normal rate and regular rhythm.     Heart sounds: No murmur. No friction rub. No gallop.   Pulmonary:     Effort: Pulmonary effort is normal. No respiratory distress.     Breath sounds: Normal breath sounds. No stridor. No rales.  Abdominal:     General: There is no distension.     Palpations: Abdomen is soft.     Tenderness: There is no abdominal tenderness.  Musculoskeletal:        General: No tenderness.  Skin:    General: Skin is warm and dry.     Findings: No erythema or rash.  Neurological:     Mental Status: She is unresponsive.     GCS: GCS eye subscore is 3. GCS verbal subscore is 2. GCS motor subscore is 5.     ED Results and Treatments Labs (all labs ordered are listed, but only abnormal results are displayed) Labs Reviewed  COMPREHENSIVE METABOLIC PANEL - Abnormal; Notable for the following components:      Result  Value   Potassium 3.1 (*)    CO2 21 (*)    Glucose, Bld 211 (*)    Calcium 8.3 (*)    All other components within normal limits  ACETAMINOPHEN LEVEL - Abnormal; Notable for the following components:   Acetaminophen (Tylenol), Serum <10 (*)    All other components within normal limits  ETHANOL - Abnormal; Notable for the following components:   Alcohol, Ethyl (B) 70 (*)    All other components within normal limits  CBG MONITORING, ED - Abnormal; Notable for the following components:   Glucose-Capillary 165 (*)    All other components within normal limits  SALICYLATE LEVEL  CBC WITH DIFFERENTIAL/PLATELET  RAPID URINE DRUG SCREEN, HOSP PERFORMED                                                                                                                         EKG  EKG Interpretation  Date/Time:  Monday May 18 2018 03:41:25 EDT Ventricular Rate:  83 PR Interval:    QRS Duration: 84 QT Interval:  423 QTC Calculation: 498 R Axis:   71 Text Interpretation:  Sinus rhythm Borderline prolonged QT interval NO STEMI. No old tracing to compare Confirmed by Drema Pry [phone]) on 05/18/2018 4:16:55 AM Also confirmed by Drema Pry [phone]), editor Barbette Hair [phone])  on 05/18/2018 7:08:48 AM Also confirmed by Drema Pry [phone]), editor Barbette Hair [phone])  on 05/18/2018 7:10:39 AM  Radiology No results found. Pertinent labs & imaging results that were available during my care of the patient were reviewed by me and considered in my medical decision making (see chart for details).  Medications Ordered in ED Medications  sodium chloride 0.9 % bolus 1,000 mL (0 mLs Intravenous Stopped 05/18/18 0550)    And  0.9 %  sodium chloride infusion ( Intravenous New Bag/Given 05/18/18 0546)  naloxone [facility](NARCAN) injection 0.4 mg (has no administration in time range)  sodium chloride 0.9 % bolus 500 mL (500 mLs Intravenous Bolus from Bag 05/18/18 0648)                                                                                                                                     Procedures Procedures  (including critical care time)  Medical Decision Making / ED Course I have reviewed the nursing notes for this encounter and the patient's prior records (if available in EHR or on provided paperwork).    Unresponsive patient likely overdose who responded to Narcan.  She is currently hemodynamically stable with a GCS of 10.  Maintaining her airway.  No need for intubation. Noted to be hypothermic and warmer placed. Screening labs obtained.  After slowly coming to she endorsed using cocaine, EtOH, and marijuana.  Denied any narcotic use.    Work up reassuring. Significant improvement in mental status w/o need for repeated narcan dosing. Temp improving. Pending stabilizing and ambulation.    Patient care turned over to Dr Patria Maneampos at 0730. Patient case and results discussed in detail; please see their note for further ED managment.    .  Final Clinical Impression(s) / ED Diagnoses Final diagnoses:  Accidental drug overdose, initial encounter      This chart was dictated using voice recognition software.  Despite best efforts to proofread,  errors can occur which can change the documentation meaning.       Nira Connardama, Owen Pagnotta Eduardo, MD 05/18/18 1739

## 2018-05-18 NOTE — ED Provider Notes (Signed)
Pt continues to feel well. Ambulatory in the ER. Likely polysubstance abuse as cause of AMS   Azalia Bilis, MD 05/18/18 [phone]

## 2018-07-06 ENCOUNTER — Encounter (HOSPITAL_COMMUNITY): Payer: Self-pay

## 2018-07-06 ENCOUNTER — Other Ambulatory Visit: Payer: Self-pay

## 2018-07-06 ENCOUNTER — Ambulatory Visit (HOSPITAL_COMMUNITY)
Admission: EM | Admit: 2018-07-06 | Discharge: 2018-07-06 | Disposition: A | Discharge status: Home / Self Care | Payer: Self-pay | Attending: Family Medicine | Admitting: Family Medicine

## 2018-07-06 DIAGNOSIS — K047 Periapical abscess without sinus: Secondary | ICD-10-CM

## 2018-07-06 DIAGNOSIS — Z760 Encounter for issue of repeat prescription: Secondary | ICD-10-CM

## 2018-07-06 DIAGNOSIS — J45909 Unspecified asthma, uncomplicated: Secondary | ICD-10-CM

## 2018-07-06 MED ORDER — HYDROCODONE-ACETAMINOPHEN 5-325 MG PO TABS: 1.0000 | ORAL_TABLET | Freq: Four times a day (QID) | ORAL | 0 refills | Status: DC | PRN

## 2018-07-06 MED ORDER — AMOXICILLIN-POT CLAVULANATE 875-125 MG PO TABS: 1.0000 | ORAL_TABLET | Freq: Two times a day (BID) | ORAL | 0 refills | Status: AC

## 2018-07-06 MED ORDER — ALBUTEROL SULFATE HFA 108 (90 BASE) MCG/ACT IN AERS: 1.0000 | INHALATION_SPRAY | Freq: Four times a day (QID) | RESPIRATORY_TRACT | 0 refills | Status: DC | PRN

## 2018-07-06 NOTE — Discharge Instructions (Signed)
Please use dental resource to contact offices to seek permenant treatment/relief.   Today we have given you an antibiotic. This should help with pain as any infection is cleared.   For pain, Tylenol Extra strength [phone] mg every 4-6 hours. Please take with food.   I have also provided 2 days worth of stronger pain medication. This should only be used for severe pain. Do not drive or operate machinery while taking this medication.   Please return if you start to experience significant swelling of your face, experiencing fever.

## 2018-07-06 NOTE — ED Triage Notes (Signed)
Patient presents to Urgent Care with complaints of left lower dental pain and swelling since 3-4 days ago. Patient reports she thinks she burst an abscess yesterday, does not have a dentist.

## 2018-07-06 NOTE — ED Provider Notes (Addendum)
[facility]    CSN: [id number] Arrival date & time: 07/06/18  [id number]     History   Chief Complaint Chief Complaint  Patient presents with  . Abscess    HPI Tricia Hood is a 48 y.o. female history of asthma, presenting today for evaluation of dental abscess and medication refill.  Patient states that over the past week she has had increased pain and swelling to her left lower jaw and believes she has a dental abscess.  She has had discomfort throughout her left side of her face/neck.  Does have some mild discomfort with moving her neck also endorsing a sore throat.  Denies any fevers.  Has had some chills.  Does not have a dental insurance currently.  Denies other associated URI symptoms.  She has been using Tylenol, allergic to NSAIDs.  Requesting refill of albuterol inhaler.  HPI  Past Medical History:  Diagnosis Date  . Asthma     Patient Active Problem List   Diagnosis Date Noted  . Pancreatitis 10/08/2014  . Cellulitis 10/07/2014  . Insect bite 10/07/2014  . Asthma 10/07/2014  . Abdominal pain 10/07/2014  . Hypokalemia 10/07/2014    Past Surgical History:  Procedure Laterality Date  . ANKLE FRACTURE SURGERY    . TONSILLECTOMY    . TUBAL LIGATION      OB History   No obstetric history on file.      Home Medications    Prior to Admission medications   Medication Sig Start Date End Date Taking? Authorizing Provider  acetaminophen (TYLENOL) 500 MG tablet Take 1 tablet (500 mg total) by mouth every 6 (six) hours as needed. 04/14/18   Fawze, Mina A, PA-C  albuterol (VENTOLIN HFA) 108 (90 Base) MCG/ACT inhaler Inhale 1-2 puffs into the lungs every 6 (six) hours as needed for wheezing or shortness of breath. 07/06/18   Mubashir Mallek C, PA-C  amoxicillin-clavulanate (AUGMENTIN) 875-125 MG tablet Take 1 tablet by mouth every 12 (twelve) hours for 7 days. 07/06/18 07/13/18  Tesla Keeler C, PA-C  diphenhydrAMINE (BENADRYL) 25 MG tablet Take 1 tablet  (25 mg total) by mouth every 6 (six) hours. 03/04/18   Harlene SaltsMorelli, Brandon A, PA-C  EPINEPHrine (EPIPEN 2-PAK) 0.3 mg/0.3 mL IJ SOAJ injection Inject 0.3 mLs (0.3 mg total) into the muscle as needed for anaphylaxis. 03/04/18   Harlene SaltsMorelli, Brandon A, PA-C  famotidine (PEPCID) 20 MG tablet Take 1 tablet (20 mg total) by mouth 2 (two) times daily. 03/04/18   Bill SalinasMorelli, Brandon A, PA-C  HYDROcodone-acetaminophen (NORCO/VICODIN) 5-325 MG tablet Take 1 tablet by mouth every 6 (six) hours as needed for severe pain. 07/06/18   Vasily Fedewa C, PA-C  naproxen sodium (ALEVE) 220 MG tablet Take 440 mg by mouth as needed (headache/pain).    [provider]  polyvinyl alcohol (LIQUIFILM TEARS) 1.4 % ophthalmic solution Place 1 drop into both eyes as needed for dry eyes.    [provider]    Family History Family History  Problem Relation Age of Onset  . Hypertension Mother     Social History Social History   Tobacco Use  . Smoking status: Current Every Day Smoker    Packs/day: 1.00    Types: Cigarettes  . Smokeless tobacco: Never Used  Substance Use Topics  . Alcohol use: No  . Drug use: No     Allergies   Nsaids and Other   Review of Systems Review of Systems  Constitutional: Negative for activity change, appetite change,  chills, fatigue and fever.  HENT: Positive for dental problem, facial swelling and sore throat. Negative for congestion, ear pain, rhinorrhea, sinus pressure and trouble swallowing.   Eyes: Negative for discharge and redness.  Respiratory: Negative for cough, chest tightness and shortness of breath.   Cardiovascular: Negative for chest pain.  Gastrointestinal: Negative for abdominal pain, diarrhea, nausea and vomiting.  Musculoskeletal: Negative for myalgias.  Skin: Negative for rash.  Neurological: Positive for headaches. Negative for dizziness and light-headedness.     Physical Exam Triage Vital Signs ED Triage Vitals  Enc Vitals Group     BP 07/06/18  0855 128/70     Pulse Rate 07/06/18 0855 75     Resp 07/06/18 0855 18     Temp 07/06/18 0855 98.2 F (36.8 C)     Temp src --      SpO2 07/06/18 0855 100 %     Weight --      Height --      Head Circumference --      Peak Flow --      Pain Score 07/06/18 0853 10     Pain Loc --      Pain Edu? --      Excl. in [place]? --    No data found.  Updated Vital Signs BP 128/70 (BP Location: Left Arm)   Pulse 75   Temp 98.2 F (36.8 C)   Resp 18   LMP 06/05/2018   SpO2 100%   Visual Acuity Right Eye Distance:   Left Eye Distance:   Bilateral Distance:    Right Eye Near:   Left Eye Near:    Bilateral Near:     Physical Exam Vitals signs and nursing note reviewed.  Constitutional:      General: She is not in acute distress.    Appearance: She is well-developed.  HENT:     Head: Normocephalic and atraumatic.     Comments: Mild facial swelling to left lower jaw, tender to touch    Ears:     Comments: Bilateral ears without tenderness to palpation of external auricle, tragus and mastoid, EAC's without erythema or swelling, TM's with good bony landmarks and cone of light. Non erythematous.    Mouth/Throat:     Comments: Oral mucosa pink and moist, no tonsillar enlargement or exudate. Posterior pharynx patent and nonerythematous, no uvula deviation or swelling. Normal phonation.  Left lower anterior jaw with swelling and gingival erythema below tooth, tender to touch  No soft palate swelling noted Eyes:     Conjunctiva/sclera: Conjunctivae normal.  Neck:     Musculoskeletal: Neck supple.     Comments: Full active range of motion of the neck, no overlying swelling or erythema, no palpable lymphadenopathy Cardiovascular:     Rate and Rhythm: Normal rate and regular rhythm.     Heart sounds: No murmur.  Pulmonary:     Effort: Pulmonary effort is normal. No respiratory distress.     Breath sounds: Normal breath sounds.  Abdominal:     Palpations: Abdomen is soft.      Tenderness: There is no abdominal tenderness.  Skin:    General: Skin is warm and dry.  Neurological:     Mental Status: She is alert.      UC Treatments / Results  Labs (all labs ordered are listed, but only abnormal results are displayed) Labs Reviewed - No data to display  EKG None  Radiology No results found.  Procedures Procedures (including critical  care time)  Medications Ordered in UC Medications - No data to display  Initial Impression / Assessment and Plan / UC Course  I have reviewed the triage vital signs and the nursing notes.  Pertinent labs & imaging results that were available during my care of the patient were reviewed by me and considered in my medical decision making (see chart for details).    Albuterol refilled.  Patient does appear to have dental abscess, will treat with Augmentin twice daily x1 week.  Continue with Tylenol.  Provided 6 tablets of hydrocodone to use for severe pain over the next 2 days.  Warm compresses.  Continue to monitor,Discussed strict return precautions. Patient verbalized understanding and is agreeable with plan.  Final Clinical Impressions(s) / UC Diagnoses   Final diagnoses:  Dental abscess     Discharge Instructions     Please use dental resource to contact offices to seek permenant treatment/relief.   Today we have given you an antibiotic. This should help with pain as any infection is cleared.   For pain, Tylenol Extra strength [phone] mg every 4-6 hours. Please take with food.   I have also provided 2 days worth of stronger pain medication. This should only be used for severe pain. Do not drive or operate machinery while taking this medication.   Please return if you start to experience significant swelling of your face, experiencing fever.   ED Prescriptions    Medication Sig Dispense Auth. Provider   albuterol (VENTOLIN HFA) 108 (90 Base) MCG/ACT inhaler Inhale 1-2 puffs into the lungs every 6 (six)  hours as needed for wheezing or shortness of breath. 1 Inhaler Amesha Bailey C, PA-C   amoxicillin-clavulanate (AUGMENTIN) 875-125 MG tablet Take 1 tablet by mouth every 12 (twelve) hours for 7 days. 14 tablet Alianah Lofton C, PA-C   HYDROcodone-acetaminophen (NORCO/VICODIN) 5-325 MG tablet Take 1 tablet by mouth every 6 (six) hours as needed for severe pain. 6 tablet Jad Johansson, [place] C, PA-C     Controlled Substance Prescriptions [place] Controlled Substance Registry consulted? Yes, Registry consulted, 1 previous rx in 2019. Benefit> risk.    Kohlton Gilpatrick, [place] C, PA-C 07/06/18 0926    Patterson HammersmithWieters, Dakari Stabler C, PA-C 07/06/18 0930

## 2019-07-29 DIAGNOSIS — Z419 Encounter for procedure for purposes other than remedying health state, unspecified: Secondary | ICD-10-CM | POA: Diagnosis not present

## 2019-08-29 DIAGNOSIS — Z419 Encounter for procedure for purposes other than remedying health state, unspecified: Secondary | ICD-10-CM | POA: Diagnosis not present

## 2019-09-29 DIAGNOSIS — Z419 Encounter for procedure for purposes other than remedying health state, unspecified: Secondary | ICD-10-CM | POA: Diagnosis not present

## 2019-10-29 DIAGNOSIS — Z419 Encounter for procedure for purposes other than remedying health state, unspecified: Secondary | ICD-10-CM | POA: Diagnosis not present

## 2019-11-29 DIAGNOSIS — Z419 Encounter for procedure for purposes other than remedying health state, unspecified: Secondary | ICD-10-CM | POA: Diagnosis not present

## 2019-12-29 DIAGNOSIS — Z419 Encounter for procedure for purposes other than remedying health state, unspecified: Secondary | ICD-10-CM | POA: Diagnosis not present

## 2020-01-29 DIAGNOSIS — Z419 Encounter for procedure for purposes other than remedying health state, unspecified: Secondary | ICD-10-CM | POA: Diagnosis not present

## 2020-02-29 DIAGNOSIS — Z419 Encounter for procedure for purposes other than remedying health state, unspecified: Secondary | ICD-10-CM | POA: Diagnosis not present

## 2020-03-28 DIAGNOSIS — Z419 Encounter for procedure for purposes other than remedying health state, unspecified: Secondary | ICD-10-CM | POA: Diagnosis not present

## 2020-04-28 DIAGNOSIS — Z419 Encounter for procedure for purposes other than remedying health state, unspecified: Secondary | ICD-10-CM | POA: Diagnosis not present

## 2020-05-28 DIAGNOSIS — Z419 Encounter for procedure for purposes other than remedying health state, unspecified: Secondary | ICD-10-CM | POA: Diagnosis not present

## 2020-06-09 ENCOUNTER — Encounter (HOSPITAL_COMMUNITY): Payer: Self-pay

## 2020-06-09 ENCOUNTER — Ambulatory Visit (HOSPITAL_COMMUNITY)
Admission: EM | Admit: 2020-06-09 | Discharge: 2020-06-09 | Disposition: A | Discharge status: Home / Self Care | Payer: Medicaid Other | Attending: Family Medicine | Admitting: Family Medicine

## 2020-06-09 ENCOUNTER — Other Ambulatory Visit: Payer: Self-pay

## 2020-06-09 DIAGNOSIS — M5432 Sciatica, left side: Secondary | ICD-10-CM | POA: Diagnosis not present

## 2020-06-09 MED ORDER — CYCLOBENZAPRINE HCL 5 MG PO TABS: 5.0000 mg | ORAL_TABLET | Freq: Two times a day (BID) | ORAL | 0 refills | Status: DC | PRN

## 2020-06-09 MED ORDER — DEXAMETHASONE SODIUM PHOSPHATE 10 MG/ML IJ SOLN
10.0000 mg | Freq: Once | INTRAMUSCULAR | Status: AC
  Administered 2020-06-09: 10 mg via INTRAMUSCULAR

## 2020-06-09 MED ORDER — GABAPENTIN 300 MG PO CAPS: 300.0000 mg | ORAL_CAPSULE | Freq: Three times a day (TID) | ORAL | 0 refills | Status: DC | PRN

## 2020-06-09 MED ORDER — DEXAMETHASONE SODIUM PHOSPHATE 10 MG/ML IJ SOLN
INTRAMUSCULAR | Status: AC
  Filled 2020-06-09: qty 1

## 2020-06-09 NOTE — ED Triage Notes (Signed)
Pt in with c/o nerve pain that started yesterday  Pt states her finger tips went numb yesterday and when she sits down her left foot feels numb  Pt states she can't bend her pain

## 2020-06-10 NOTE — ED Provider Notes (Signed)
[facility]    CSN: [id number] Arrival date & time: 06/09/20  1935      History   Chief Complaint Chief Complaint  Patient presents with  . Nerve pain    HPI Tricia Hood is a 50 y.o. female.   Presenting today with 1 day history of left low back, posterior leg down to ankle pain, numbness, tingling that seem to start with some heavy lifting at work.  She has a history of herniated disc and right-sided sciatica for which she has had neurosurgery to correct.  States this feels very similar just on the other side.  Denies weakness, bowel or bladder incontinence, fever, chills, dysuria, leg swelling or discoloration..  So far taking over-the-counter pain relievers with minimal relief.  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     Past Medical History:  Diagnosis Date  . Asthma     Patient Active Problem List   Diagnosis Date Noted  . Pancreatitis 10/08/2014  . Cellulitis 10/07/2014  . Insect bite 10/07/2014  . Asthma 10/07/2014  . Abdominal pain 10/07/2014  . Hypokalemia 10/07/2014    Past Surgical History:  Procedure Laterality Date  . ANKLE FRACTURE SURGERY    . TONSILLECTOMY    . TUBAL LIGATION      OB History   No obstetric history on file.      Home Medications    Prior to Admission medications   Medication Sig Start Date End Date Taking? Authorizing Provider  cyclobenzaprine (FLEXERIL) 5 MG tablet Take 1 tablet (5 mg total) by mouth 2 (two) times daily as needed for muscle spasms. Do not drink alcohol or drive while taking this medication.  May cause drowsiness. 06/09/20  Yes Particia Nearing, PA-C  gabapentin (NEURONTIN) 300 MG capsule Take 1 capsule (300 mg total) by mouth 3 (three) times daily as needed. May cause drowsiness 06/09/20  Yes Particia Nearing, PA-C  acetaminophen (TYLENOL) 500 MG tablet Take 1 tablet (500 mg total) by mouth every 6 (six) hours as needed.  04/14/18   Fawze, Mina A, PA-C  albuterol (VENTOLIN HFA) 108 (90 Base) MCG/ACT inhaler Inhale 1-2 puffs into the lungs every 6 (six) hours as needed for wheezing or shortness of breath. 07/06/18   Wieters, Hallie C, PA-C  diphenhydrAMINE (BENADRYL) 25 MG tablet Take 1 tablet (25 mg total) by mouth every 6 (six) hours. 03/04/18   Harlene Salts A, PA-C  EPINEPHrine (EPIPEN 2-PAK) 0.3 mg/0.3 mL IJ SOAJ injection Inject 0.3 mLs (0.3 mg total) into the muscle as needed for anaphylaxis. 03/04/18   Harlene Salts A, PA-C  famotidine (PEPCID) 20 MG tablet Take 1 tablet (20 mg total) by mouth 2 (two) times daily. 03/04/18   Bill Salinas, PA-C  HYDROcodone-acetaminophen (NORCO/VICODIN) 5-325 MG tablet Take 1 tablet by mouth every 6 (six) hours as needed for severe pain. 07/06/18   Wieters, Hallie C, PA-C  naproxen sodium (ALEVE) 220 MG tablet Take 440 mg by mouth as needed (headache/pain).    [provider]  polyvinyl alcohol (LIQUIFILM TEARS) 1.4 % ophthalmic solution Place 1 drop into both eyes as needed for dry eyes.    [provider]    Family History Family History  Problem Relation Age of Onset  . Hypertension Mother     Social History Social History   Tobacco Use  . Smoking status: Current Every Day Smoker    Packs/day: 1.00  Types: Cigarettes  . Smokeless tobacco: Never Used  Vaping Use  . Vaping Use: Never used  Substance Use Topics  . Alcohol use: No  . Drug use: No     Allergies   Nsaids and Other   Review of Systems Review of Systems Per HPI Physical Exam Triage Vital Signs ED Triage Vitals  Enc Vitals Group     BP 06/09/20 2007 138/88     Pulse Rate 06/09/20 2007 (!) 101     Resp 06/09/20 2007 19     Temp --      Temp src --      SpO2 06/09/20 2007 99 %     Weight --      Height --      Head Circumference --      Peak Flow --      Pain Score 06/09/20 2006 10     Pain Loc --      Pain Edu? --      Excl. in [place]? --    No data  found.  Updated Vital Signs BP 138/88   Pulse (!) 101   Resp 19   SpO2 99%   Visual Acuity Right Eye Distance:   Left Eye Distance:   Bilateral Distance:    Right Eye Near:   Left Eye Near:    Bilateral Near:     Physical Exam Vitals and nursing note reviewed.  Constitutional:      Appearance: Normal appearance. She is not ill-appearing.  HENT:     Head: Atraumatic.     Mouth/Throat:     Mouth: Mucous membranes are moist.     Pharynx: Oropharynx is clear.  Eyes:     Extraocular Movements: Extraocular movements intact.     Conjunctiva/sclera: Conjunctivae normal.  Cardiovascular:     Rate and Rhythm: Normal rate and regular rhythm.     Heart sounds: Normal heart sounds.  Pulmonary:     Effort: Pulmonary effort is normal.     Breath sounds: Normal breath sounds.  Musculoskeletal:        General: Tenderness present. No swelling, deformity or signs of injury. Normal range of motion.     Cervical back: Normal range of motion and neck supple.     Comments: Unable to perform full range of motion or straight leg raise exams given extent of her burning pain in the left lower extremity.  Does appear to have range of motion in all joints of this leg though very painful to do so.  Gait antalgic.  No significant lumbar spinal tenderness palpation centrally.  Left lateral lumbar musculature tender to palpation extending down into the left gluteal muscles  Skin:    General: Skin is warm and dry.     Findings: No erythema or lesion.  Neurological:     Mental Status: She is alert and oriented to person, place, and time.     Comments: Left lower extremity neurovascularly intact  Psychiatric:        Thought Content: Thought content normal.        Judgment: Judgment normal.     Comments: Anxious, tearful, appears uncomfortable      UC Treatments / Results  Labs (all labs ordered are listed, but only abnormal results are displayed) Labs Reviewed - No data to  display  EKG   Radiology No results found.  Procedures Procedures (including critical care time)  Medications Ordered in UC Medications  dexamethasone (DECADRON) injection 10 mg (10 mg  Intramuscular Given 06/09/20 2046)    Initial Impression / Assessment and Plan / UC Course  I have reviewed the triage vital signs and the nursing notes.  Pertinent labs & imaging results that were available during my care of the patient were reviewed by me and considered in my medical decision making (see chart for details).     Suspect sciatica of the left leg.  Will treat with IM Decadron in clinic, sent home on gabapentin, Flexeril.  Work note given for rest.  Follow-up with neurosurgery if not fully resolving.  Final Clinical Impressions(s) / UC Diagnoses   Final diagnoses:  Left sided sciatica   Discharge Instructions   None    ED Prescriptions    Medication Sig Dispense Auth. Provider   gabapentin (NEURONTIN) 300 MG capsule Take 1 capsule (300 mg total) by mouth 3 (three) times daily as needed. May cause drowsiness 90 capsule Particia Nearing, [place]   cyclobenzaprine (FLEXERIL) 5 MG tablet Take 1 tablet (5 mg total) by mouth 2 (two) times daily as needed for muscle spasms. Do not drink alcohol or drive while taking this medication.  May cause drowsiness. 10 tablet Particia Nearing, [place]     PDMP not reviewed this encounter.   Particia Nearing, [place] 06/10/20 1246

## 2020-06-14 DIAGNOSIS — M5416 Radiculopathy, lumbar region: Secondary | ICD-10-CM | POA: Diagnosis not present

## 2020-06-14 DIAGNOSIS — Z6836 Body mass index (BMI) 36.0-36.9, adult: Secondary | ICD-10-CM | POA: Diagnosis not present

## 2020-06-14 DIAGNOSIS — N914 Secondary oligomenorrhea: Secondary | ICD-10-CM | POA: Diagnosis not present

## 2020-06-14 DIAGNOSIS — F17210 Nicotine dependence, cigarettes, uncomplicated: Secondary | ICD-10-CM | POA: Diagnosis not present

## 2020-06-27 DIAGNOSIS — Z6835 Body mass index (BMI) 35.0-35.9, adult: Secondary | ICD-10-CM | POA: Diagnosis not present

## 2020-06-27 DIAGNOSIS — R030 Elevated blood-pressure reading, without diagnosis of hypertension: Secondary | ICD-10-CM | POA: Diagnosis not present

## 2020-06-27 DIAGNOSIS — M5416 Radiculopathy, lumbar region: Secondary | ICD-10-CM | POA: Diagnosis not present

## 2020-06-28 DIAGNOSIS — Z419 Encounter for procedure for purposes other than remedying health state, unspecified: Secondary | ICD-10-CM | POA: Diagnosis not present

## 2020-07-03 ENCOUNTER — Ambulatory Visit: Payer: Medicaid Other | Admitting: Physical Therapy

## 2020-07-10 ENCOUNTER — Other Ambulatory Visit: Payer: Self-pay

## 2020-07-10 ENCOUNTER — Ambulatory Visit: Payer: Medicaid Other | Attending: Neurosurgery | Admitting: Physical Therapy

## 2020-07-10 ENCOUNTER — Encounter: Payer: Self-pay | Admitting: Physical Therapy

## 2020-07-10 DIAGNOSIS — M79605 Pain in left leg: Secondary | ICD-10-CM | POA: Insufficient documentation

## 2020-07-10 DIAGNOSIS — M545 Low back pain, unspecified: Secondary | ICD-10-CM | POA: Diagnosis not present

## 2020-07-10 DIAGNOSIS — M6281 Muscle weakness (generalized): Secondary | ICD-10-CM | POA: Diagnosis not present

## 2020-07-10 DIAGNOSIS — M25652 Stiffness of left hip, not elsewhere classified: Secondary | ICD-10-CM | POA: Diagnosis not present

## 2020-07-10 DIAGNOSIS — M25552 Pain in left hip: Secondary | ICD-10-CM | POA: Insufficient documentation

## 2020-07-10 NOTE — Therapy (Addendum)
yet performed.  Patient has had several ED visits secondary pain and s/p some type of lumbar neurosurgery.  Patient will benefit from skilled PT to address deficits with ongoing assessment for lumbar vs L hip involvement and maximize functional mobility and daily activity including patient goal or returning to work with less pain.    Personal Factors and Comorbidities Time since onset of injury/illness/exacerbation    Examination-Activity Limitations Bed Mobility;Bend;Carry;Dressing;Lift;Locomotion Level;Sit;Squat;Stand;Transfers    Examination-Participation Restrictions Cleaning;Community Activity;Occupation;Laundry    Stability/Clinical Decision Making Stable/Uncomplicated    Clinical Decision Making Low    Rehab Potential Good    PT Frequency 2x / week    PT Duration 8 weeks    PT Treatment/Interventions ADLs/Self Care Home Management;Electrical Stimulation;Aquatic Therapy;Traction;DME Instruction;Gait training;Functional mobility training;Therapeutic activities;Stair training;Therapeutic exercise;Balance training;Neuromuscular re-education;Patient/family education;Manual techniques;Passive range of motion;Spinal Manipulations;Joint Manipulations    PT Next Visit Plan Assess affects of HEP and progress as appropriate, Manual treatment as appropriate, 6 min walk test to assess endurance, general LE strengthening, ongoing assessment L hip vs lumbar, core  stability.    PT Home Exercise Plan [email or web address]    Consulted and Agree with Plan of Care Patient             Patient will benefit from skilled therapeutic intervention in order to improve the following deficits and impairments:  Abnormal gait, Decreased mobility, Difficulty walking, Decreased range of motion, Impaired perceived functional ability, Improper body mechanics, Decreased activity tolerance, Decreased strength, Impaired flexibility, Pain  Visit Diagnosis: Low back pain radiating to left leg  Pain in left hip  Stiffness of left hip, not elsewhere classified  Muscle weakness (generalized)     Problem List Patient Active Problem List   Diagnosis Date Noted   Pancreatitis 10/08/2014   Cellulitis 10/07/2014   Insect bite 10/07/2014   Asthma 10/07/2014   Abdominal pain 10/07/2014   Hypokalemia 10/07/2014    Myrla Halsted, PT 07/10/2020, 9:32 PM  [facility] [address] [place], [place], [address] Phone: [phone]   Fax:  [phone]  Name: Tricia Hood MRN: [id number] Date of Birth: 02-27-1970  [facility] Authorization   Choose one: Rehabilitative  Standardized Assessment or Functional Outcome Tool: See Pain Assessment and 6 minute walk  Score or Percent Disability: 80%  Body Parts Treated (Select each separately):  Lumbopelvic. Overall deficits/functional limitations for body part selected: severe Other L Hip . Overall deficits/functional limitations for body part selected: severe N/A. Overall deficits/functional limitations for body part selected:        Patient is being discharged due to not returning since evaluation.  Kristoffer Leamon PT, DPT, LAT, ATC  10/16/20  8:39 AM  yet performed.  Patient has had several ED visits secondary pain and s/p some type of lumbar neurosurgery.  Patient will benefit from skilled PT to address deficits with ongoing assessment for lumbar vs L hip involvement and maximize functional mobility and daily activity including patient goal or returning to work with less pain.    Personal Factors and Comorbidities Time since onset of injury/illness/exacerbation    Examination-Activity Limitations Bed Mobility;Bend;Carry;Dressing;Lift;Locomotion Level;Sit;Squat;Stand;Transfers    Examination-Participation Restrictions Cleaning;Community Activity;Occupation;Laundry    Stability/Clinical Decision Making Stable/Uncomplicated    Clinical Decision Making Low    Rehab Potential Good    PT Frequency 2x / week    PT Duration 8 weeks    PT Treatment/Interventions ADLs/Self Care Home Management;Electrical Stimulation;Aquatic Therapy;Traction;DME Instruction;Gait training;Functional mobility training;Therapeutic activities;Stair training;Therapeutic exercise;Balance training;Neuromuscular re-education;Patient/family education;Manual techniques;Passive range of motion;Spinal Manipulations;Joint Manipulations    PT Next Visit Plan Assess affects of HEP and progress as appropriate, Manual treatment as appropriate, 6 min walk test to assess endurance, general LE strengthening, ongoing assessment L hip vs lumbar, core  stability.    PT Home Exercise Plan [email or web address]    Consulted and Agree with Plan of Care Patient             Patient will benefit from skilled therapeutic intervention in order to improve the following deficits and impairments:  Abnormal gait, Decreased mobility, Difficulty walking, Decreased range of motion, Impaired perceived functional ability, Improper body mechanics, Decreased activity tolerance, Decreased strength, Impaired flexibility, Pain  Visit Diagnosis: Low back pain radiating to left leg  Pain in left hip  Stiffness of left hip, not elsewhere classified  Muscle weakness (generalized)     Problem List Patient Active Problem List   Diagnosis Date Noted   Pancreatitis 10/08/2014   Cellulitis 10/07/2014   Insect bite 10/07/2014   Asthma 10/07/2014   Abdominal pain 10/07/2014   Hypokalemia 10/07/2014    Myrla Halsted, PT 07/10/2020, 9:32 PM  [facility] [address] [place], [place], [address] Phone: [phone]   Fax:  [phone]  Name: Tricia Hood MRN: [id number] Date of Birth: 02-27-1970  [facility] Authorization   Choose one: Rehabilitative  Standardized Assessment or Functional Outcome Tool: See Pain Assessment and 6 minute walk  Score or Percent Disability: 80%  Body Parts Treated (Select each separately):  Lumbopelvic. Overall deficits/functional limitations for body part selected: severe Other L Hip . Overall deficits/functional limitations for body part selected: severe N/A. Overall deficits/functional limitations for body part selected:        Patient is being discharged due to not returning since evaluation.  Kristoffer Leamon PT, DPT, LAT, ATC  10/16/20  8:39 AM  [facility] [address] [place], [place], [address] Phone: [phone]   Fax:  [phone]  Physical Therapy Evaluation / Discharge  Patient Details  Name: Tricia Hood MRN: [id number] Date of Birth: 1970/08/03 Referring Provider (PT): Hoyt Koch, MD   Encounter Date: 07/10/2020   PT End of Session - 07/10/20 1223     Visit Number 1    Number of Visits 17    Date for PT Re-Evaluation 09/08/20    Authorization Type [employer]    Authorization Time Period pending    PT Start Time 1223    PT Stop Time 1311    PT Time Calculation (min) 48 min    Activity Tolerance Patient limited by pain    Behavior During Therapy [facility] for tasks assessed/performed             Past Medical History:  Diagnosis Date   Asthma     Past Surgical History:  Procedure Laterality Date   ANKLE FRACTURE SURGERY     TONSILLECTOMY     TUBAL LIGATION      There were no vitals filed for this visit.    Subjective Assessment - 07/10/20 1234     Subjective Patient reports lumbar pain radiating into L LE x6 months, then in May she was standing on a high chair and fell off followed by working a double shift lumbar pain and the pain got so bad she went to the ER. The medications help but continues with the pain into L LE. She told she has a "slipped" disc in 2008. Now pain exacerbated pain in L side lumbar pain and radicular symptoms into L >R LE.    Pertinent History Athsma    Limitations Sitting;Lifting;Standing;Walking    How long can you sit comfortably? 0 min    How long can you stand comfortably?    How long can you walk comfortably?    Diagnostic tests XRAY, MRI ordered.    Patient Stated Goals To stand and walk normally or better with less pain at work.    Currently in Pain? Yes    Pain Score 0-No pain   Max this week=10/10   Pain Location Back    Pain Orientation Lower    Pain Descriptors / Indicators Aching     Pain Type Chronic pain    Pain Radiating Towards L LE    Pain Onset More than a month ago    Pain Frequency Constant    Aggravating Factors  Walking, squatting, donning shoes and socks.    Pain Relieving Factors Medication, ice    Multiple Pain Sites Yes    Pain Score 5    Pain Location Leg    Pain Orientation Left    Pain Descriptors / Indicators Shooting;Tingling;Burning    Pain Type Chronic pain    Pain Radiating Towards L foot    Pain Onset More than a month ago    Pain Frequency Constant    Aggravating Factors  Sitting position    Pain Relieving Factors Repositioning                [facility] PT Assessment - 07/10/20 0001       Assessment   Medical Diagnosis Low back pain    Referring Provider (PT) Hoyt Koch, MD    Onset Date/Surgical Date 06/06/20    Hand Dominance Right    Next MD Visit 07/18/2020    Prior Therapy YES R leg/ankle  [facility] [address] [place], [place], [address] Phone: [phone]   Fax:  [phone]  Physical Therapy Evaluation / Discharge  Patient Details  Name: Tricia Hood MRN: [id number] Date of Birth: 1970/08/03 Referring Provider (PT): Hoyt Koch, MD   Encounter Date: 07/10/2020   PT End of Session - 07/10/20 1223     Visit Number 1    Number of Visits 17    Date for PT Re-Evaluation 09/08/20    Authorization Type [employer]    Authorization Time Period pending    PT Start Time 1223    PT Stop Time 1311    PT Time Calculation (min) 48 min    Activity Tolerance Patient limited by pain    Behavior During Therapy [facility] for tasks assessed/performed             Past Medical History:  Diagnosis Date   Asthma     Past Surgical History:  Procedure Laterality Date   ANKLE FRACTURE SURGERY     TONSILLECTOMY     TUBAL LIGATION      There were no vitals filed for this visit.    Subjective Assessment - 07/10/20 1234     Subjective Patient reports lumbar pain radiating into L LE x6 months, then in May she was standing on a high chair and fell off followed by working a double shift lumbar pain and the pain got so bad she went to the ER. The medications help but continues with the pain into L LE. She told she has a "slipped" disc in 2008. Now pain exacerbated pain in L side lumbar pain and radicular symptoms into L >R LE.    Pertinent History Athsma    Limitations Sitting;Lifting;Standing;Walking    How long can you sit comfortably? 0 min    How long can you stand comfortably?    How long can you walk comfortably?    Diagnostic tests XRAY, MRI ordered.    Patient Stated Goals To stand and walk normally or better with less pain at work.    Currently in Pain? Yes    Pain Score 0-No pain   Max this week=10/10   Pain Location Back    Pain Orientation Lower    Pain Descriptors / Indicators Aching     Pain Type Chronic pain    Pain Radiating Towards L LE    Pain Onset More than a month ago    Pain Frequency Constant    Aggravating Factors  Walking, squatting, donning shoes and socks.    Pain Relieving Factors Medication, ice    Multiple Pain Sites Yes    Pain Score 5    Pain Location Leg    Pain Orientation Left    Pain Descriptors / Indicators Shooting;Tingling;Burning    Pain Type Chronic pain    Pain Radiating Towards L foot    Pain Onset More than a month ago    Pain Frequency Constant    Aggravating Factors  Sitting position    Pain Relieving Factors Repositioning                [facility] PT Assessment - 07/10/20 0001       Assessment   Medical Diagnosis Low back pain    Referring Provider (PT) Hoyt Koch, MD    Onset Date/Surgical Date 06/06/20    Hand Dominance Right    Next MD Visit 07/18/2020    Prior Therapy YES R leg/ankle

## 2020-07-10 NOTE — Patient Instructions (Addendum)
Access Code: [email or web address] URL: [email or web address] Date: 07/10/2020 Prepared by: Myrla Halsted  Exercises Supine Lower Trunk Rotation - 1 x daily - 7 x weekly - 3 sets - 10 reps Supine Hip Adduction Isometric with Ball - 1 x daily - 7 x weekly - 3 sets - 10 reps Supine Bridge - 1 x daily - 7 x weekly - 3 sets - 10 reps

## 2020-07-11 DIAGNOSIS — M545 Low back pain, unspecified: Secondary | ICD-10-CM | POA: Diagnosis not present

## 2020-07-11 DIAGNOSIS — M5416 Radiculopathy, lumbar region: Secondary | ICD-10-CM | POA: Diagnosis not present

## 2020-07-18 ENCOUNTER — Other Ambulatory Visit: Payer: Self-pay

## 2020-07-18 ENCOUNTER — Encounter: Payer: Self-pay | Admitting: Physical Therapy

## 2020-07-18 ENCOUNTER — Ambulatory Visit: Payer: Medicaid Other | Admitting: Physical Therapy

## 2020-07-18 DIAGNOSIS — M5416 Radiculopathy, lumbar region: Secondary | ICD-10-CM | POA: Diagnosis not present

## 2020-07-18 DIAGNOSIS — M25552 Pain in left hip: Secondary | ICD-10-CM | POA: Diagnosis not present

## 2020-07-18 DIAGNOSIS — M79605 Pain in left leg: Secondary | ICD-10-CM | POA: Diagnosis not present

## 2020-07-18 DIAGNOSIS — M545 Low back pain, unspecified: Secondary | ICD-10-CM

## 2020-07-18 DIAGNOSIS — M6281 Muscle weakness (generalized): Secondary | ICD-10-CM

## 2020-07-18 DIAGNOSIS — Z6835 Body mass index (BMI) 35.0-35.9, adult: Secondary | ICD-10-CM | POA: Diagnosis not present

## 2020-07-18 DIAGNOSIS — M25652 Stiffness of left hip, not elsewhere classified: Secondary | ICD-10-CM | POA: Diagnosis not present

## 2020-07-18 DIAGNOSIS — R030 Elevated blood-pressure reading, without diagnosis of hypertension: Secondary | ICD-10-CM | POA: Diagnosis not present

## 2020-07-18 NOTE — Therapy (Signed)
[place], [place], [address] Phone: [phone]   Fax:  [phone]  Physical Therapy Treatment  Patient Details  Name: Tricia Hood MRN: [id number] Date of Birth: Sep 24, 1970 Referring Provider (PT): Duffy Rhody, MD   Encounter Date: 07/18/2020   PT End of Session - 07/18/20 1237     Visit Number 2    Number of Visits 17    Date for PT Re-Evaluation 09/08/20    Authorization Type [employer], approved 12 visits    Authorization Time Period 07/18/20-09/16/20    Authorization - Visit Number 1    Authorization - Number of Visits 12    PT Start Time 7341    PT Stop Time 1315    PT Time Calculation (min) 42 min             Past Medical History:  Diagnosis Date   Asthma     Past Surgical History:  Procedure Laterality Date   ANKLE FRACTURE SURGERY     TONSILLECTOMY     TUBAL LIGATION      There were no vitals filed for this visit.   Subjective Assessment - 07/18/20 1238     Subjective I can walk and the exercises are good. I still have trouble twisting and getting up and down to low seats like the toilet.    Currently in Pain? Yes    Pain Score 7     Pain Location Back    Pain Orientation Lower    Pain Descriptors / Indicators Aching    Pain Type Chronic pain    Pain Radiating Towards L LE, groin    Aggravating Factors  low seats, twisting    Pain Relieving Factors meds, ice                               [facility] Adult PT Treatment/Exercise - 07/18/20 0001       Lumbar Exercises: Stretches   Single Knee to Chest Stretch 3 reps;10 seconds    Single Knee to Chest Stretch Limitations used towel to assist LLE    Lower Trunk Rotation 10 seconds    Lower Trunk Rotation Limitations 10 reps with cues for technique      Lumbar Exercises: Supine   Bridge 10 reps    Bridge Limitations 2 sets    Bridge with [employer] 10 reps    Bridge with [employer]  Limitations 2 sets    Other Supine Lumbar Exercises Hooklying hip add squeeze x10      Lumbar Exercises: Sidelying   Clam 10 reps    Clam Limitations 2 sets      Knee/Hip Exercises: Seated   Sit to Sand 10 reps   without UE, min cues                   PT Education - 07/18/20 1415     Education Details HEP    Person(s) Educated Patient    Methods Explanation;Handout    Comprehension Verbalized understanding              PT Short Term Goals - 07/18/20 1300       PT SHORT TERM GOAL #1   Title Patient will be independent with initial HEP for trunk mobility and general conditioning.    Baseline indeoendent with initial HEP    Time 2    Period Weeks  order to improve the following deficits and impairments:  Abnormal gait, Decreased mobility, Difficulty walking, Decreased range of motion, Impaired perceived functional ability, Improper body mechanics, Decreased activity tolerance, Decreased strength, Impaired flexibility, Pain  Visit Diagnosis: Low back pain radiating to left leg  Stiffness of left hip, not elsewhere classified  Muscle weakness (generalized)  Pain in left  hip     Problem List Patient Active Problem List   Diagnosis Date Noted   Pancreatitis 10/08/2014   Cellulitis 10/07/2014   Insect bite 10/07/2014   Asthma 10/07/2014   Abdominal pain 10/07/2014   Hypokalemia 10/07/2014    Dorene Ar, PTA 07/18/2020, 2:18 PM  [facility] [address] [place], [place], [address] Phone: [phone]   Fax:  [phone]  Name: Tricia Hood MRN: [id number] Date of Birth: [birth year]  order to improve the following deficits and impairments:  Abnormal gait, Decreased mobility, Difficulty walking, Decreased range of motion, Impaired perceived functional ability, Improper body mechanics, Decreased activity tolerance, Decreased strength, Impaired flexibility, Pain  Visit Diagnosis: Low back pain radiating to left leg  Stiffness of left hip, not elsewhere classified  Muscle weakness (generalized)  Pain in left  hip     Problem List Patient Active Problem List   Diagnosis Date Noted   Pancreatitis 10/08/2014   Cellulitis 10/07/2014   Insect bite 10/07/2014   Asthma 10/07/2014   Abdominal pain 10/07/2014   Hypokalemia 10/07/2014    Dorene Ar, PTA 07/18/2020, 2:18 PM  [facility] [address] [place], [place], [address] Phone: [phone]   Fax:  [phone]  Name: Tricia Hood MRN: [id number] Date of Birth: [birth year]

## 2020-07-18 NOTE — Patient Instructions (Signed)
Access Code: [email or web address] URL: [email or web address] Date: 07/18/2020 Prepared by: Jannette Spanner  Exercises Supine Lower Trunk Rotation - 1 x daily - 7 x weekly - 3 sets - 10 reps Supine Hip Adduction Isometric with Ball - 1 x daily - 7 x weekly - 3 sets - 10 reps Supine Bridge - 1 x daily - 7 x weekly - 3 sets - 10 reps Clamshell - 1 x daily - 7 x weekly - 2 sets - 10 reps Sit to Stand Without Arm Support - 1 x daily - 7 x weekly - 2 sets - 10 reps Supine March - 1 x daily - 7 x weekly - 2 sets - 10 reps

## 2020-07-21 ENCOUNTER — Ambulatory Visit: Payer: Medicaid Other | Admitting: Physical Therapy

## 2020-07-25 ENCOUNTER — Ambulatory Visit: Payer: Medicaid Other | Admitting: Physical Therapy

## 2020-07-25 ENCOUNTER — Telehealth: Payer: Self-pay | Admitting: Physical Therapy

## 2020-07-25 NOTE — Telephone Encounter (Signed)
Attempted to contact patient regarding no show to appointment. The call could not be completed at this time and was unable to leave a voicemail.

## 2020-07-28 ENCOUNTER — Ambulatory Visit: Payer: Medicaid Other | Attending: Neurosurgery | Admitting: Physical Therapy

## 2020-07-28 ENCOUNTER — Telehealth: Payer: Self-pay | Admitting: Physical Therapy

## 2020-07-28 DIAGNOSIS — Z419 Encounter for procedure for purposes other than remedying health state, unspecified: Secondary | ICD-10-CM | POA: Diagnosis not present

## 2020-07-28 NOTE — Telephone Encounter (Signed)
Contacted patient regarding  no show and the call was answered. She reports that she thought her appointment was on the 24th. I reminded her that she canceled June 24th but also missed two other appointments including today. Afterward, the phone line was disconnected and patient did not respond. Attempted to contact her again and the call went to voicemail. Left message regarding attendance policy and her next appointment date and time. She will be allowed to make appointments one at a time going forward.

## 2020-08-01 ENCOUNTER — Ambulatory Visit: Payer: Medicaid Other | Admitting: Physical Therapy

## 2020-08-04 ENCOUNTER — Encounter: Payer: Medicaid Other | Admitting: Physical Therapy

## 2020-08-08 ENCOUNTER — Encounter: Payer: Medicaid Other | Admitting: Physical Therapy

## 2020-08-11 ENCOUNTER — Encounter: Payer: Medicaid Other | Admitting: Physical Therapy

## 2020-09-22 IMAGING — CR CHEST - 2 VIEW
2 series · 2 of 2 positions shown · non-contrast
Comparison: 10/23/2012.

CLINICAL DATA: Body aches with cough and congestion.

EXAM:
CHEST - 2 VIEW

[w chest pa]
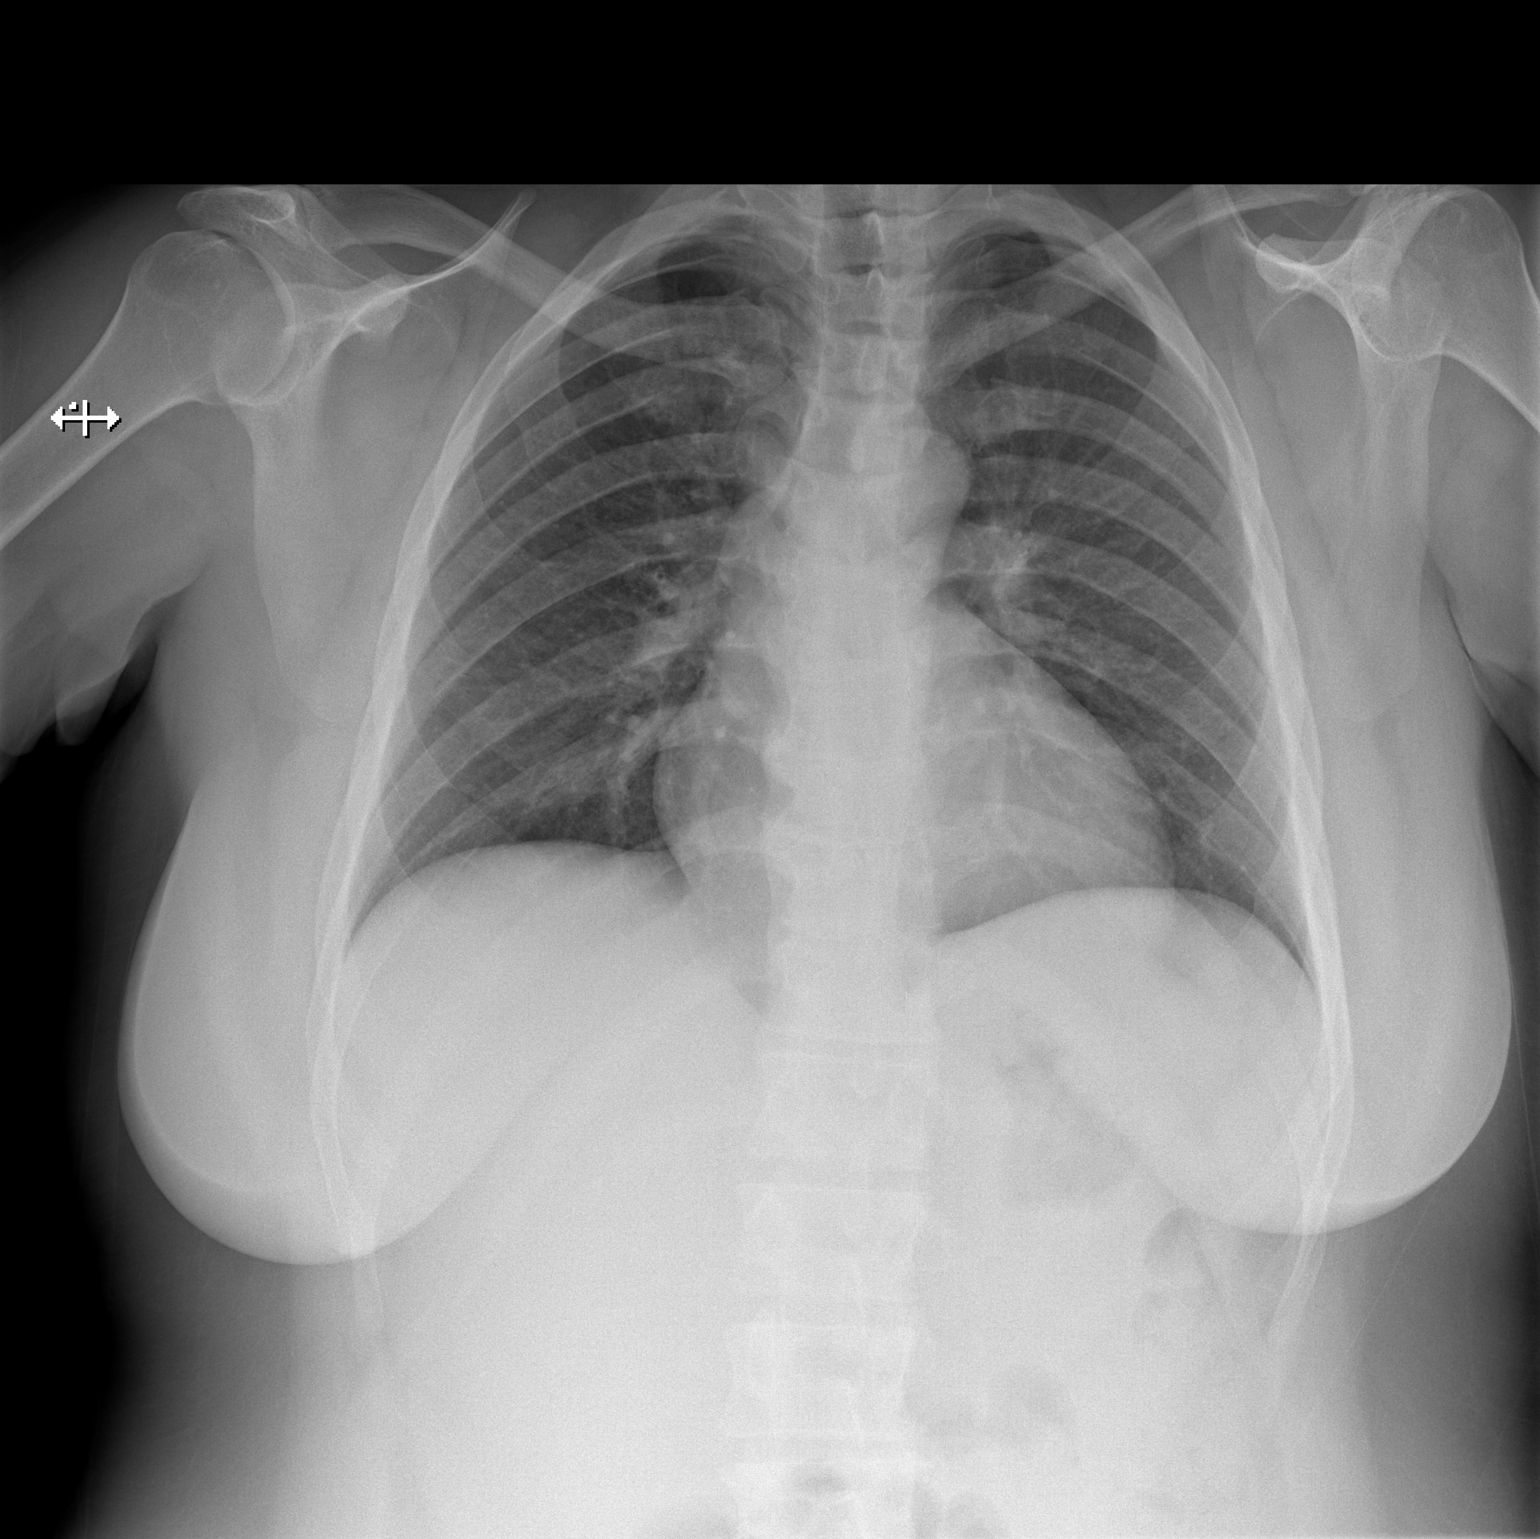

[w chest lat]
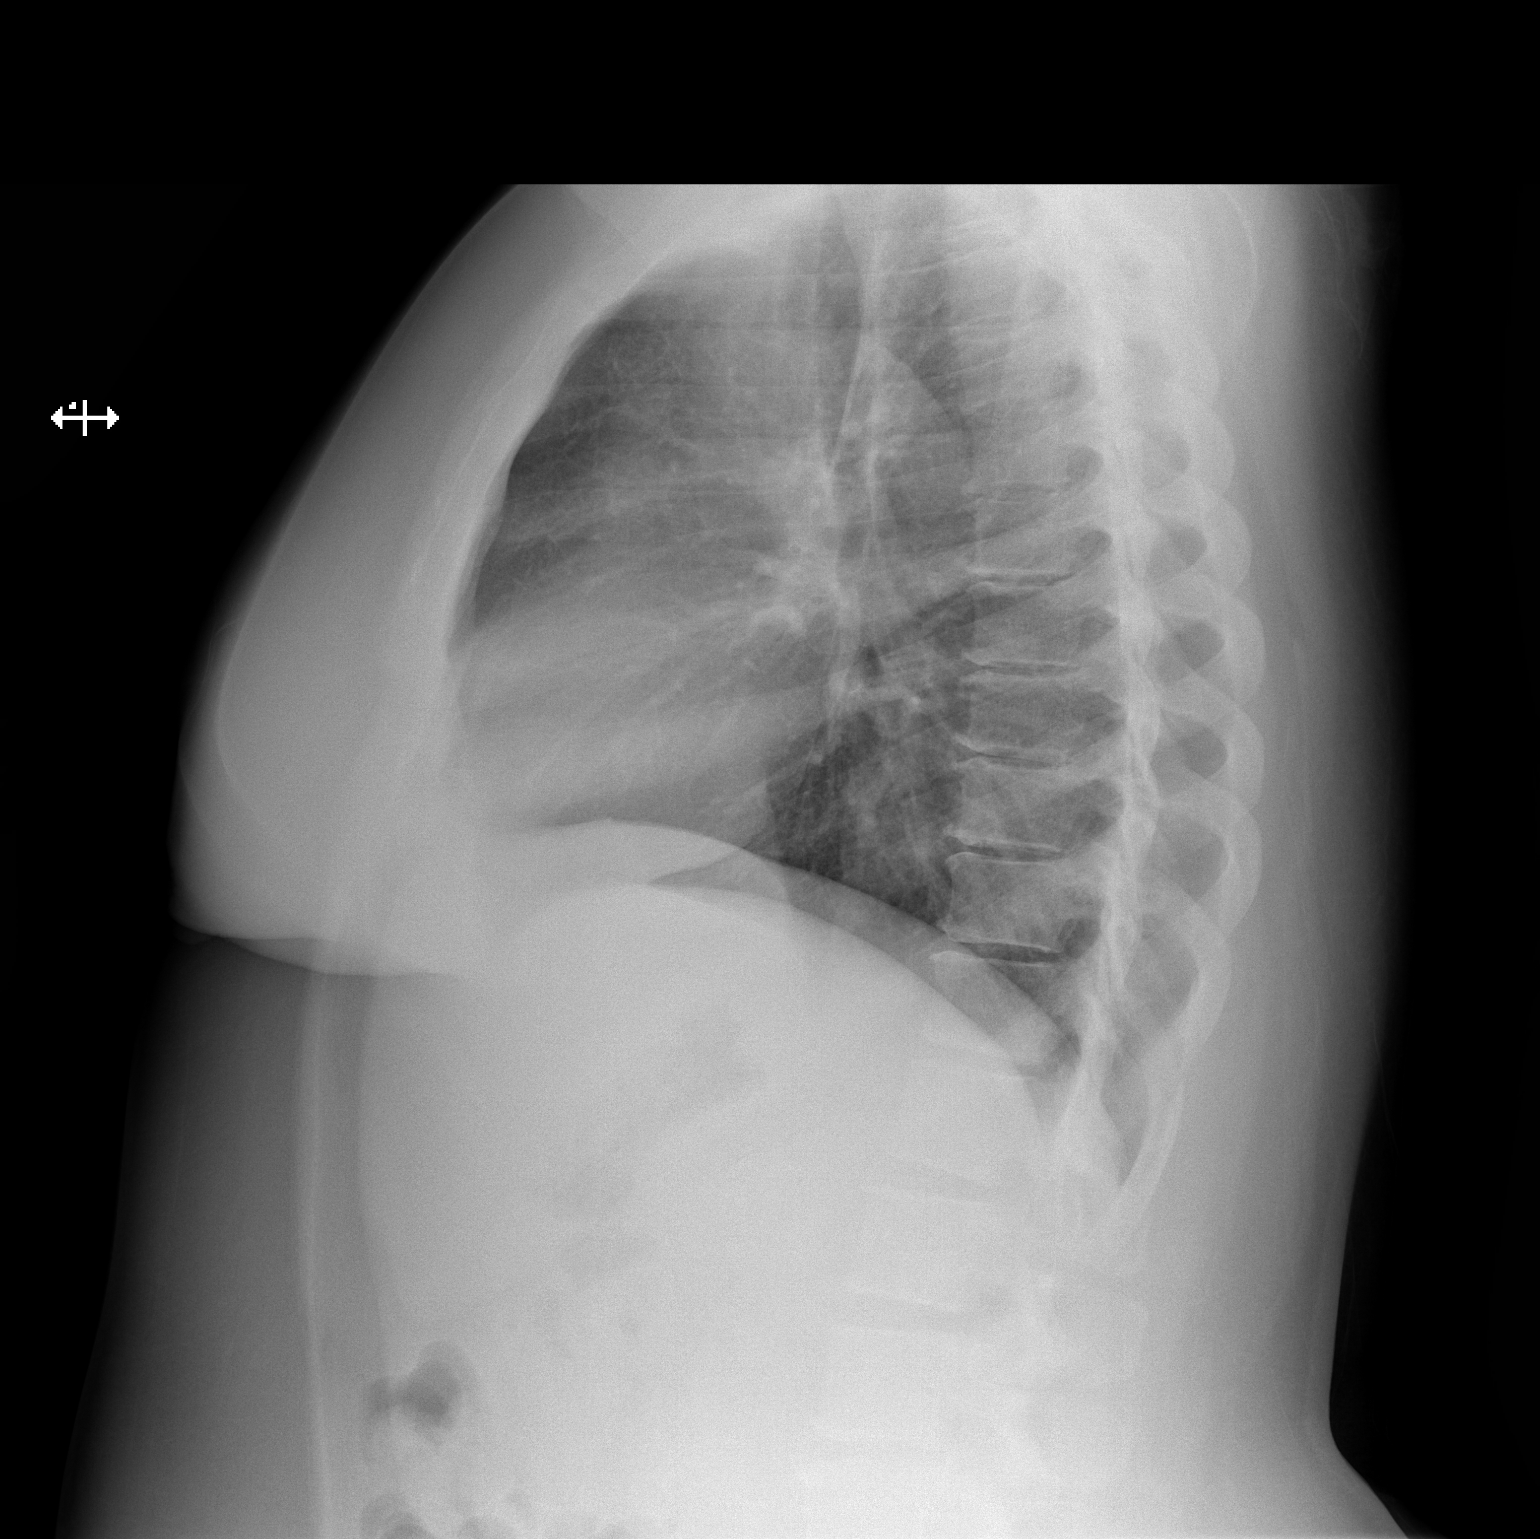

[2 of 2 positions shown; findings below may reference images not displayed]

FINDINGS: The heart size and mediastinal contours are within normal limits.
Both lungs are clear. The visualized skeletal structures are
unremarkable.
IMPRESSION: No active cardiopulmonary disease.

## 2020-09-28 DIAGNOSIS — Z419 Encounter for procedure for purposes other than remedying health state, unspecified: Secondary | ICD-10-CM | POA: Diagnosis not present

## 2020-10-23 DIAGNOSIS — Z1159 Encounter for screening for other viral diseases: Secondary | ICD-10-CM | POA: Diagnosis not present

## 2020-10-23 DIAGNOSIS — E7800 Pure hypercholesterolemia, unspecified: Secondary | ICD-10-CM | POA: Diagnosis not present

## 2020-10-23 DIAGNOSIS — E8881 Metabolic syndrome: Secondary | ICD-10-CM | POA: Diagnosis not present

## 2020-10-23 DIAGNOSIS — Z87891 Personal history of nicotine dependence: Secondary | ICD-10-CM | POA: Diagnosis not present

## 2020-10-23 DIAGNOSIS — R5383 Other fatigue: Secondary | ICD-10-CM | POA: Diagnosis not present

## 2020-10-23 DIAGNOSIS — Z780 Asymptomatic menopausal state: Secondary | ICD-10-CM | POA: Diagnosis not present

## 2020-10-23 DIAGNOSIS — Z131 Encounter for screening for diabetes mellitus: Secondary | ICD-10-CM | POA: Diagnosis not present

## 2020-10-23 DIAGNOSIS — E349 Endocrine disorder, unspecified: Secondary | ICD-10-CM | POA: Diagnosis not present

## 2020-10-23 DIAGNOSIS — Z79899 Other long term (current) drug therapy: Secondary | ICD-10-CM | POA: Diagnosis not present

## 2020-10-23 DIAGNOSIS — E669 Obesity, unspecified: Secondary | ICD-10-CM | POA: Diagnosis not present

## 2020-10-23 DIAGNOSIS — R635 Abnormal weight gain: Secondary | ICD-10-CM | POA: Diagnosis not present

## 2020-10-23 DIAGNOSIS — F17210 Nicotine dependence, cigarettes, uncomplicated: Secondary | ICD-10-CM | POA: Diagnosis not present

## 2020-10-23 DIAGNOSIS — D539 Nutritional anemia, unspecified: Secondary | ICD-10-CM | POA: Diagnosis not present

## 2020-10-23 DIAGNOSIS — R0602 Shortness of breath: Secondary | ICD-10-CM | POA: Diagnosis not present

## 2020-10-23 DIAGNOSIS — E559 Vitamin D deficiency, unspecified: Secondary | ICD-10-CM | POA: Diagnosis not present

## 2020-10-28 DIAGNOSIS — Z419 Encounter for procedure for purposes other than remedying health state, unspecified: Secondary | ICD-10-CM | POA: Diagnosis not present

## 2020-11-28 DIAGNOSIS — Z419 Encounter for procedure for purposes other than remedying health state, unspecified: Secondary | ICD-10-CM | POA: Diagnosis not present

## 2020-12-28 DIAGNOSIS — Z419 Encounter for procedure for purposes other than remedying health state, unspecified: Secondary | ICD-10-CM | POA: Diagnosis not present

## 2021-01-12 ENCOUNTER — Encounter (HOSPITAL_COMMUNITY): Payer: Self-pay

## 2021-01-12 ENCOUNTER — Ambulatory Visit (HOSPITAL_COMMUNITY)
Admission: EM | Admit: 2021-01-12 | Discharge: 2021-01-12 | Disposition: A | Discharge status: Home / Self Care | Payer: Medicaid Other | Attending: Student | Admitting: Student

## 2021-01-12 ENCOUNTER — Other Ambulatory Visit: Payer: Self-pay

## 2021-01-12 DIAGNOSIS — Z760 Encounter for issue of repeat prescription: Secondary | ICD-10-CM

## 2021-01-12 DIAGNOSIS — J0100 Acute maxillary sinusitis, unspecified: Secondary | ICD-10-CM

## 2021-01-12 DIAGNOSIS — J45901 Unspecified asthma with (acute) exacerbation: Secondary | ICD-10-CM | POA: Diagnosis not present

## 2021-01-12 DIAGNOSIS — M5432 Sciatica, left side: Secondary | ICD-10-CM

## 2021-01-12 MED ORDER — AMOXICILLIN 875 MG PO TABS: 875.0000 mg | ORAL_TABLET | Freq: Two times a day (BID) | ORAL | 0 refills | Status: AC

## 2021-01-12 MED ORDER — TIZANIDINE HCL 2 MG PO TABS: 2.0000 mg | ORAL_TABLET | Freq: Three times a day (TID) | ORAL | 0 refills | Status: DC | PRN

## 2021-01-12 MED ORDER — ALBUTEROL SULFATE HFA 108 (90 BASE) MCG/ACT IN AERS: 1.0000 | INHALATION_SPRAY | Freq: Four times a day (QID) | RESPIRATORY_TRACT | 0 refills | Status: DC | PRN

## 2021-01-12 NOTE — ED Triage Notes (Signed)
Pt presents with c/o a sore throat , headache, cholls x 1 week. States she wants a refill on her albuterol inhaler.

## 2021-01-12 NOTE — ED Provider Notes (Signed)
[facility]    CSN: [id number] Arrival date & time: 01/12/21  1052      History   Chief Complaint Chief Complaint  Patient presents with   Headache   Sore Throat   Chills    HPI CAITY VICTORIAN is a 50 y.o. female presenting with viral syndrome x1 week. Medical history asthma, requesting refill on albuterol inhaler. Describes sore throat, headaches, chills. Hasn't monitored temperature at home. Requesting refill on her albuterol inhaler, has been using her mom's inhaler prn. Denies SOB, CP, dizziness, weakness. Cough is nonproductive. Also with few days of pain and swelling under eyes, describes this as pressure. Also describes acute exacerbation of her chronic L-sided sciatica. States this is related to her active job. Pain radiating down back of L leg, worse with standing and ambulation. Denies urinary symptoms.  HPI  Past Medical History:  Diagnosis Date   Asthma     Patient Active Problem List   Diagnosis Date Noted   Pancreatitis 10/08/2014   Cellulitis 10/07/2014   Insect bite 10/07/2014   Asthma 10/07/2014   Abdominal pain 10/07/2014   Hypokalemia 10/07/2014    Past Surgical History:  Procedure Laterality Date   ANKLE FRACTURE SURGERY     TONSILLECTOMY     TUBAL LIGATION      OB History   No obstetric history on file.      Home Medications    Prior to Admission medications   Medication Sig Start Date End Date Taking? Authorizing Provider  albuterol (VENTOLIN HFA) 108 (90 Base) MCG/ACT inhaler Inhale 1-2 puffs into the lungs every 6 (six) hours as needed for wheezing or shortness of breath. 01/12/21  Yes Hazel Sams, PA-C  amoxicillin (AMOXIL) 875 MG tablet Take 1 tablet (875 mg total) by mouth 2 (two) times daily for 7 days. 01/12/21 01/19/21 Yes Hazel Sams, PA-C  tiZANidine (ZANAFLEX) 2 MG tablet Take 1 tablet (2 mg total) by mouth every 8 (eight) hours as needed for muscle spasms. 01/12/21  Yes Hazel Sams, PA-C   acetaminophen (TYLENOL) 500 MG tablet Take 1 tablet (500 mg total) by mouth every 6 (six) hours as needed. 04/14/18   Fawze, Mina A, PA-C  diphenhydrAMINE (BENADRYL) 25 MG tablet Take 1 tablet (25 mg total) by mouth every 6 (six) hours. 03/04/18   Nuala Alpha A, PA-C  EPINEPHrine (EPIPEN 2-PAK) 0.3 mg/0.3 mL IJ SOAJ injection Inject 0.3 mLs (0.3 mg total) into the muscle as needed for anaphylaxis. 03/04/18   Nuala Alpha A, PA-C  famotidine (PEPCID) 20 MG tablet Take 1 tablet (20 mg total) by mouth 2 (two) times daily. 03/04/18   Nuala Alpha A, PA-C  gabapentin (NEURONTIN) 300 MG capsule Take 1 capsule (300 mg total) by mouth 3 (three) times daily as needed. May cause drowsiness 06/09/20   Volney American, PA-C  HYDROcodone-acetaminophen (NORCO/VICODIN) 5-325 MG tablet Take 1 tablet by mouth every 6 (six) hours as needed for severe pain. 07/06/18   Wieters, Hallie C, PA-C  naproxen sodium (ALEVE) 220 MG tablet Take 440 mg by mouth as needed (headache/pain).    [provider]  polyvinyl alcohol (LIQUIFILM TEARS) 1.4 % ophthalmic solution Place 1 drop into both eyes as needed for dry eyes.    [provider]    Family History Family History  Problem Relation Age of Onset   Hypertension Mother     Social History Social History   Tobacco Use   Smoking status: Every Day  Packs/day: 1.00    Types: Cigarettes   Smokeless tobacco: Never  Vaping Use   Vaping Use: Never used  Substance Use Topics   Alcohol use: No   Drug use: No     Allergies   Nsaids, Other, Shellfish allergy, and Citrus   Review of Systems Review of Systems  Constitutional:  Negative for appetite change, chills and fever.  HENT:  Positive for congestion and sore throat. Negative for ear pain, rhinorrhea, sinus pressure and sinus pain.   Eyes:  Negative for redness and visual disturbance.  Respiratory:  Positive for cough. Negative for chest tightness, shortness of breath and  wheezing.   Cardiovascular:  Negative for chest pain and palpitations.  Gastrointestinal:  Negative for abdominal pain, constipation, diarrhea, nausea and vomiting.  Genitourinary:  Negative for dysuria, frequency and urgency.  Musculoskeletal:  Negative for myalgias.  Neurological:  Negative for dizziness, weakness and headaches.  Psychiatric/Behavioral:  Negative for confusion.   All other systems reviewed and are negative.   Physical Exam Triage Vital Signs ED Triage Vitals  Enc Vitals Group     BP 01/12/21 1222 123/87     Pulse Rate 01/12/21 1222 77     Resp 01/12/21 1222 20     Temp 01/12/21 1222 98.6 F (37 C)     Temp Source 01/12/21 1222 Oral     SpO2 01/12/21 1222 95 %     Weight --      Height --      Head Circumference --      Peak Flow --      Pain Score 01/12/21 1220 5     Pain Loc --      Pain Edu? --      Excl. in [place]? --    No data found.  Updated Vital Signs BP 123/87 (BP Location: Right Arm)    Pulse 77    Temp 98.6 F (37 C) (Oral)    Resp 20    SpO2 95%   Visual Acuity Right Eye Distance:   Left Eye Distance:   Bilateral Distance:    Right Eye Near:   Left Eye Near:    Bilateral Near:     Physical Exam Vitals reviewed.  Constitutional:      General: She is not in acute distress.    Appearance: Normal appearance. She is not ill-appearing.  HENT:     Head: Normocephalic and atraumatic.     Right Ear: Tympanic membrane, ear canal and external ear normal. No tenderness. No middle ear effusion. There is no impacted cerumen. Tympanic membrane is not perforated, erythematous, retracted or bulging.     Left Ear: Tympanic membrane, ear canal and external ear normal. No tenderness.  No middle ear effusion. There is no impacted cerumen. Tympanic membrane is not perforated, erythematous, retracted or bulging.     Nose: No congestion.     Right Sinus: Maxillary sinus tenderness present.     Left Sinus: Maxillary sinus tenderness present.      Mouth/Throat:     Mouth: Mucous membranes are moist.     Pharynx: Uvula midline. No oropharyngeal exudate or posterior oropharyngeal erythema.  Eyes:     Extraocular Movements: Extraocular movements intact.     Pupils: Pupils are equal, round, and reactive to light.  Cardiovascular:     Rate and Rhythm: Normal rate and regular rhythm.     Heart sounds: Normal heart sounds.  Pulmonary:     Effort: Pulmonary effort is normal.  Breath sounds: Normal breath sounds. No decreased breath sounds, wheezing, rhonchi or rales.  Abdominal:     Palpations: Abdomen is soft.     Tenderness: There is no abdominal tenderness. There is no guarding or rebound.  Lymphadenopathy:     Cervical: No cervical adenopathy.     Right cervical: No superficial cervical adenopathy.    Left cervical: No superficial cervical adenopathy.  Neurological:     General: No focal deficit present.     Mental Status: She is alert and oriented to person, place, and time.  Psychiatric:        Mood and Affect: Mood normal.        Behavior: Behavior normal.        Thought Content: Thought content normal.        Judgment: Judgment normal.     UC Treatments / Results  Labs (all labs ordered are listed, but only abnormal results are displayed) Labs Reviewed - No data to display  EKG   Radiology No results found.  Procedures Procedures (including critical care time)  Medications Ordered in UC Medications - No data to display  Initial Impression / Assessment and Plan / UC Course  I have reviewed the triage vital signs and the nursing notes.  Pertinent labs & imaging results that were available during my care of the patient were reviewed by me and considered in my medical decision making (see chart for details).     This patient is a very pleasant 49 y.o. year old female presenting with viral URI with cough. Today this pt is afebrile nontachycardic nontachypneic, oxygenating well on room air, no wheezes rhonchi  or rales. Requesting refill on albuterol inhaler for asthma, sent. Amoxicillin for sinusitis. Zanaflex for sciatica. States she is not pregnant or breastfeeding. ED return precautions discussed. Patient verbalizes understanding and agreement.   Final Clinical Impressions(s) / UC Diagnoses   Final diagnoses:  Mild asthma exacerbation  Medication refill  Left sided sciatica  Acute non-recurrent maxillary sinusitis     Discharge Instructions      -Albuterol inhaler as needed for cough, wheezing, shortness of breath, 1 to 2 puffs every 6 hours as needed. -Amoxicillin twice daily x7 days -Start the muscle relaxer-Zanaflex (tizanidine), up to 3 times daily for muscle spasms and pain.  This can make you drowsy, so take at bedtime or when you do not need to drive or operate machinery. -You can take Tylenol up to 1000 mg 3 times daily, and ibuprofen up to 600 mg 3 times daily with food.  You can take these together, or alternate every 3-4 hours. -With a virus, you're typically contagious for 5-7 days, or as long as you're having fevers. You probably aren't contagious anymore, but we need to treat your sinus infection to make you feel better.     ED Prescriptions     Medication Sig Dispense Auth. Provider   albuterol (VENTOLIN HFA) 108 (90 Base) MCG/ACT inhaler Inhale 1-2 puffs into the lungs every 6 (six) hours as needed for wheezing or shortness of breath. 1 each Hazel Sams, PA-C   amoxicillin (AMOXIL) 875 MG tablet Take 1 tablet (875 mg total) by mouth 2 (two) times daily for 7 days. 14 tablet Hazel Sams, PA-C   tiZANidine (ZANAFLEX) 2 MG tablet Take 1 tablet (2 mg total) by mouth every 8 (eight) hours as needed for muscle spasms. 21 tablet Hazel Sams, PA-C      PDMP not reviewed this encounter.  Rhys Martini, PA-C 01/12/21 1310

## 2021-01-12 NOTE — Discharge Instructions (Signed)
-  Albuterol inhaler as needed for cough, wheezing, shortness of breath, 1 to 2 puffs every 6 hours as needed. -Amoxicillin twice daily x7 days -Start the muscle relaxer-Zanaflex (tizanidine), up to 3 times daily for muscle spasms and pain.  This can make you drowsy, so take at bedtime or when you do not need to drive or operate machinery. -You can take Tylenol up to 1000 mg 3 times daily, and ibuprofen up to 600 mg 3 times daily with food.  You can take these together, or alternate every 3-4 hours. -With a virus, you're typically contagious for 5-7 days, or as long as you're having fevers. You probably aren't contagious anymore, but we need to treat your sinus infection to make you feel better.

## 2021-01-28 DIAGNOSIS — Z419 Encounter for procedure for purposes other than remedying health state, unspecified: Secondary | ICD-10-CM | POA: Diagnosis not present

## 2021-02-28 DIAGNOSIS — Z419 Encounter for procedure for purposes other than remedying health state, unspecified: Secondary | ICD-10-CM | POA: Diagnosis not present

## 2021-03-28 DIAGNOSIS — Z419 Encounter for procedure for purposes other than remedying health state, unspecified: Secondary | ICD-10-CM | POA: Diagnosis not present

## 2021-04-28 DIAGNOSIS — Z419 Encounter for procedure for purposes other than remedying health state, unspecified: Secondary | ICD-10-CM | POA: Diagnosis not present

## 2021-05-28 DIAGNOSIS — Z419 Encounter for procedure for purposes other than remedying health state, unspecified: Secondary | ICD-10-CM | POA: Diagnosis not present

## 2021-06-28 DIAGNOSIS — Z419 Encounter for procedure for purposes other than remedying health state, unspecified: Secondary | ICD-10-CM | POA: Diagnosis not present

## 2021-07-28 DIAGNOSIS — Z419 Encounter for procedure for purposes other than remedying health state, unspecified: Secondary | ICD-10-CM | POA: Diagnosis not present

## 2021-08-21 ENCOUNTER — Encounter: Payer: Self-pay | Admitting: Family

## 2021-08-21 NOTE — Progress Notes (Deleted)
This encounter was created in error - please disregard.

## 2021-08-21 NOTE — Progress Notes (Signed)
  This encounter was created in error - please disregard. No show 

## 2021-08-28 DIAGNOSIS — Z419 Encounter for procedure for purposes other than remedying health state, unspecified: Secondary | ICD-10-CM | POA: Diagnosis not present

## 2021-09-28 DIAGNOSIS — Z419 Encounter for procedure for purposes other than remedying health state, unspecified: Secondary | ICD-10-CM | POA: Diagnosis not present

## 2021-10-28 DIAGNOSIS — Z419 Encounter for procedure for purposes other than remedying health state, unspecified: Secondary | ICD-10-CM | POA: Diagnosis not present

## 2021-11-28 DIAGNOSIS — Z419 Encounter for procedure for purposes other than remedying health state, unspecified: Secondary | ICD-10-CM | POA: Diagnosis not present

## 2021-12-11 ENCOUNTER — Ambulatory Visit: Payer: Medicaid Other | Admitting: Nurse Practitioner

## 2021-12-28 DIAGNOSIS — Z419 Encounter for procedure for purposes other than remedying health state, unspecified: Secondary | ICD-10-CM | POA: Diagnosis not present

## 2022-01-28 DIAGNOSIS — Z419 Encounter for procedure for purposes other than remedying health state, unspecified: Secondary | ICD-10-CM | POA: Diagnosis not present

## 2022-02-28 DIAGNOSIS — Z419 Encounter for procedure for purposes other than remedying health state, unspecified: Secondary | ICD-10-CM | POA: Diagnosis not present

## 2022-03-29 DIAGNOSIS — Z419 Encounter for procedure for purposes other than remedying health state, unspecified: Secondary | ICD-10-CM | POA: Diagnosis not present

## 2022-04-29 DIAGNOSIS — Z419 Encounter for procedure for purposes other than remedying health state, unspecified: Secondary | ICD-10-CM | POA: Diagnosis not present

## 2022-05-06 ENCOUNTER — Ambulatory Visit: Payer: Medicaid Other | Admitting: Internal Medicine

## 2022-05-29 DIAGNOSIS — Z419 Encounter for procedure for purposes other than remedying health state, unspecified: Secondary | ICD-10-CM | POA: Diagnosis not present

## 2022-06-29 DIAGNOSIS — Z419 Encounter for procedure for purposes other than remedying health state, unspecified: Secondary | ICD-10-CM | POA: Diagnosis not present

## 2022-07-04 ENCOUNTER — Encounter: Payer: 59 | Admitting: Obstetrics

## 2022-07-23 ENCOUNTER — Encounter: Payer: 59 | Admitting: Obstetrics & Gynecology

## 2022-07-24 ENCOUNTER — Telehealth (INDEPENDENT_AMBULATORY_CARE_PROVIDER_SITE_OTHER): Payer: Self-pay | Admitting: Primary Care

## 2022-07-24 NOTE — Telephone Encounter (Signed)
Left VM with pt.

## 2022-07-25 ENCOUNTER — Ambulatory Visit (INDEPENDENT_AMBULATORY_CARE_PROVIDER_SITE_OTHER): Payer: 59 | Admitting: Primary Care

## 2022-07-25 ENCOUNTER — Ambulatory Visit (HOSPITAL_COMMUNITY)
Admission: EM | Admit: 2022-07-25 | Discharge: 2022-07-26 | Disposition: A | Discharge status: Other Acute Inpatient Hospital | Payer: 59 | Attending: Behavioral Health | Admitting: Behavioral Health

## 2022-07-25 ENCOUNTER — Telehealth (INDEPENDENT_AMBULATORY_CARE_PROVIDER_SITE_OTHER): Payer: Self-pay | Admitting: Licensed Clinical Social Worker

## 2022-07-25 ENCOUNTER — Encounter (HOSPITAL_COMMUNITY): Payer: Self-pay | Admitting: Emergency Medicine

## 2022-07-25 ENCOUNTER — Other Ambulatory Visit: Payer: Self-pay

## 2022-07-25 VITALS — BP 120/83 | HR 83 | Ht 66.0 in | Wt 204.0 lb

## 2022-07-25 DIAGNOSIS — M545 Low back pain, unspecified: Secondary | ICD-10-CM

## 2022-07-25 DIAGNOSIS — F4310 Post-traumatic stress disorder, unspecified: Secondary | ICD-10-CM | POA: Insufficient documentation

## 2022-07-25 DIAGNOSIS — E6609 Other obesity due to excess calories: Secondary | ICD-10-CM

## 2022-07-25 DIAGNOSIS — Z7689 Persons encountering health services in other specified circumstances: Secondary | ICD-10-CM | POA: Diagnosis not present

## 2022-07-25 DIAGNOSIS — F17210 Nicotine dependence, cigarettes, uncomplicated: Secondary | ICD-10-CM

## 2022-07-25 DIAGNOSIS — F4323 Adjustment disorder with mixed anxiety and depressed mood: Secondary | ICD-10-CM

## 2022-07-25 DIAGNOSIS — G8929 Other chronic pain: Secondary | ICD-10-CM | POA: Diagnosis not present

## 2022-07-25 DIAGNOSIS — R45851 Suicidal ideations: Secondary | ICD-10-CM | POA: Insufficient documentation

## 2022-07-25 DIAGNOSIS — Z6834 Body mass index (BMI) 34.0-34.9, adult: Secondary | ICD-10-CM

## 2022-07-25 DIAGNOSIS — F411 Generalized anxiety disorder: Secondary | ICD-10-CM | POA: Insufficient documentation

## 2022-07-25 DIAGNOSIS — R45850 Homicidal ideations: Secondary | ICD-10-CM

## 2022-07-25 DIAGNOSIS — F32A Depression, unspecified: Secondary | ICD-10-CM | POA: Diagnosis not present

## 2022-07-25 DIAGNOSIS — F333 Major depressive disorder, recurrent, severe with psychotic symptoms: Secondary | ICD-10-CM | POA: Diagnosis not present

## 2022-07-25 DIAGNOSIS — F419 Anxiety disorder, unspecified: Secondary | ICD-10-CM | POA: Diagnosis not present

## 2022-07-25 DIAGNOSIS — Z720 Tobacco use: Secondary | ICD-10-CM

## 2022-07-25 LAB — CBC WITH DIFFERENTIAL/PLATELET
Abs Immature Granulocytes: 0.01 10*3/uL (ref 0.00–0.07)
Basophils Absolute: 0 10*3/uL (ref 0.0–0.1)
Basophils Relative: 0 %
Eosinophils Absolute: 0.4 10*3/uL (ref 0.0–0.5)
Eosinophils Relative: 6 %
HCT: 42.9 % (ref 36.0–46.0)
Hemoglobin: 14.6 g/dL (ref 12.0–15.0)
Immature Granulocytes: 0 %
Lymphocytes Relative: 58 %
Lymphs Abs: 4.3 10*3/uL — ABNORMAL HIGH (ref 0.7–4.0)
MCH: 30.9 pg (ref 26.0–34.0)
MCHC: 34 g/dL (ref 30.0–36.0)
MCV: 90.7 fL (ref 80.0–100.0)
Monocytes Absolute: 0.6 10*3/uL (ref 0.1–1.0)
Monocytes Relative: 8 %
Neutro Abs: 2.1 10*3/uL (ref 1.7–7.7)
Neutrophils Relative %: 28 %
Platelets: 310 10*3/uL (ref 150–400)
RBC: 4.73 MIL/uL (ref 3.87–5.11)
RDW: 13.1 % (ref 11.5–15.5)
WBC: 7.3 10*3/uL (ref 4.0–10.5)
nRBC: 0 % (ref 0.0–0.2)

## 2022-07-25 LAB — POCT URINE DRUG SCREEN - MANUAL ENTRY (I-SCREEN)
POC Amphetamine UR: NOT DETECTED
POC Buprenorphine (BUP): NOT DETECTED
POC Cocaine UR: POSITIVE — AB
POC Marijuana UR: POSITIVE — AB
POC Methadone UR: NOT DETECTED
POC Methamphetamine UR: NOT DETECTED
POC Morphine: NOT DETECTED
POC Oxazepam (BZO): NOT DETECTED
POC Oxycodone UR: NOT DETECTED
POC Secobarbital (BAR): NOT DETECTED

## 2022-07-25 LAB — COMPREHENSIVE METABOLIC PANEL
ALT: 15 U/L (ref 0–44)
AST: 17 U/L (ref 15–41)
Albumin: 3.8 g/dL (ref 3.5–5.0)
Alkaline Phosphatase: 53 U/L (ref 38–126)
Anion gap: 11 (ref 5–15)
BUN: 8 mg/dL (ref 6–20)
CO2: 23 mmol/L (ref 22–32)
Calcium: 9.1 mg/dL (ref 8.9–10.3)
Chloride: 103 mmol/L (ref 98–111)
Creatinine, Ser: 0.71 mg/dL (ref 0.44–1.00)
GFR, Estimated: >60 mL/min (ref >60)
Glucose, Bld: 85 mg/dL (ref 70–99)
Potassium: 3.5 mmol/L (ref 3.5–5.1)
Sodium: 137 mmol/L (ref 135–145)
Total Bilirubin: 0.8 mg/dL (ref 0.3–1.2)
Total Protein: 6.7 g/dL (ref 6.5–8.1)

## 2022-07-25 LAB — POC URINE PREG, ED: Preg Test, Ur: NEGATIVE

## 2022-07-25 LAB — TSH: TSH: 1.689 u[IU]/mL (ref 0.350–4.500)

## 2022-07-25 LAB — LIPID PANEL
Cholesterol: 191 mg/dL (ref 0–200)
HDL: 52 mg/dL (ref >40)
LDL Cholesterol: 121 mg/dL — ABNORMAL HIGH (ref 0–99)
Total CHOL/HDL Ratio: 3.7 RATIO
Triglycerides: 91 mg/dL (ref <150)
VLDL: 18 mg/dL (ref 0–40)

## 2022-07-25 LAB — ETHANOL: Alcohol, Ethyl (B): <10 mg/dL (ref <10)

## 2022-07-25 LAB — HIV ANTIBODY (ROUTINE TESTING W REFLEX): HIV Screen 4th Generation wRfx: NONREACTIVE

## 2022-07-25 MED ORDER — MAGNESIUM HYDROXIDE 400 MG/5ML PO SUSP: 30.0000 mL | Freq: Every day | ORAL | Status: DC | PRN

## 2022-07-25 MED ORDER — GABAPENTIN 100 MG PO CAPS
100.0000 mg | ORAL_CAPSULE | Freq: Two times a day (BID) | ORAL | Status: DC
  Administered 2022-07-25 – 2022-07-26 (×2): 100 mg via ORAL
  Filled 2022-07-25 (×2): qty 1

## 2022-07-25 MED ORDER — ALUM & MAG HYDROXIDE-SIMETH 200-200-20 MG/5ML PO SUSP: 30.0000 mL | ORAL | Status: DC | PRN

## 2022-07-25 MED ORDER — ACETAMINOPHEN 325 MG PO TABS
650.0000 mg | ORAL_TABLET | Freq: Four times a day (QID) | ORAL | Status: DC | PRN
  Administered 2022-07-25: 650 mg via ORAL
  Filled 2022-07-25: qty 2

## 2022-07-25 MED ORDER — HYDROXYZINE HCL 25 MG PO TABS
25.0000 mg | ORAL_TABLET | Freq: Three times a day (TID) | ORAL | Status: DC | PRN
  Administered 2022-07-25 (×2): 25 mg via ORAL
  Filled 2022-07-25 (×3): qty 1

## 2022-07-25 MED ORDER — NICOTINE 21 MG/24HR TD PT24
21.0000 mg | MEDICATED_PATCH | Freq: Every day | TRANSDERMAL | Status: DC
  Administered 2022-07-25 – 2022-07-26 (×2): 21 mg via TRANSDERMAL
  Filled 2022-07-25 (×2): qty 1

## 2022-07-25 MED ORDER — ARIPIPRAZOLE 5 MG PO TABS
5.0000 mg | ORAL_TABLET | Freq: Every day | ORAL | Status: DC
  Administered 2022-07-25 – 2022-07-26 (×2): 5 mg via ORAL
  Filled 2022-07-25 (×2): qty 1

## 2022-07-25 MED ORDER — DIPHENHYDRAMINE HCL 25 MG PO CAPS
25.0000 mg | ORAL_CAPSULE | Freq: Once | ORAL | Status: AC
  Administered 2022-07-25: 25 mg via ORAL
  Filled 2022-07-25: qty 1

## 2022-07-25 MED ORDER — TRAZODONE HCL 50 MG PO TABS
50.0000 mg | ORAL_TABLET | Freq: Every evening | ORAL | Status: DC | PRN
  Administered 2022-07-25: 50 mg via ORAL
  Filled 2022-07-25: qty 1
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
## 2022-07-25 NOTE — ED Notes (Signed)
Pt A&O x 4, awake & resting, no distress noted. Calm & cooperative.  Monitoring for safety.    Pending [facility] in am.

## 2022-07-25 NOTE — ED Notes (Signed)
Pt calm and cooperative she is having conversation with another patient she is calm and cooperative no pain or distress noted will continue to monitor for safety
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
## 2022-07-25 NOTE — Progress Notes (Signed)
Pt was accepted to [facility] 07/26/22; Bed Assignment Main [facility]   Pt meets inpatient criteria per Loreen Freud     Attending Physician will be Dr. Loni Beckwith   Report can be called to:[phone]-Pager number, please leave a returned phone number to receive a phone call back.    Pt can arrive after 9:00am    Care Team notified: Liborio Nixon, NP, Dairl Ponder, RN   Maryjean Ka, MSW, [facility] 07/25/2022 5:29 PM

## 2022-07-25 NOTE — Progress Notes (Signed)
Depression Not sleeping at night due to thoughts of dying in her sleep Frequent headaches Anxiety attacks
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
## 2022-07-25 NOTE — Progress Notes (Signed)
This CSW received a phone call from [facility] Intake informing that pt could transfer this evening.   -At 2110 Per Rodney Langton, LPN has agreed to call report and coordinate transport.  Care Team notified: Rodney Langton, LPN, Hansel Starling, RN    Maryjean Ka, MSW, [facility] 07/25/2022 9:11 PM

## 2022-07-25 NOTE — Progress Notes (Signed)
[facility] called and shared th/at there was miscommunication and pt will transfer tomorrow 07/26/22. CSW notified Rodney Langton, LPN.  Maryjean Ka, MSW, [facility] 07/25/2022 10:25 PM

## 2022-07-25 NOTE — ED Notes (Signed)
Patient transferred to Obs unit. Patient A&Ox4. Patient endorses passive SI and denies HI, and A/V/H with no plan or intent. Patient oriented to unit and provide with a meal. Patient remains cooperative on unit and observed socializing appropriately with peers.

## 2022-07-25 NOTE — Progress Notes (Signed)
LCSW Progress Note  [id number]   Tricia Hood  07/25/2022  3:49 PM  Description:   Inpatient Psychiatric Referral  Patient was recommended inpatient per Liborio Nixon, NP. There are no available beds at [facility] or [facility], per [facility], RN. Patient was referred to the following out of network facilities:   [facility] Provider Address Phone Fax  [facility]  [address], [place] [address] [phone]  [facility]  [address] [place], [place] [address] [phone]  [facility] [address] [place]  [address] [place] [address] [phone]  [facility]  [address] [place], [place] [address] [phone]  [facility]  [address] [place] [address] [phone]  [facility]  [address] [place] [employer] [employer] [place] [address] [phone]  [facility]  [address] [place] [address] [phone]  [facility]  [address] [place], [place] [address] [phone]  [facility]  [address] [place] [address] [phone]  [facility]  [address] [place] [address] [phone]  [facility]  [address] [place] [address] [phone]  [facility]  [address], [place] [address] [phone]  [facility]  [address] [place] [address] [phone]  [facility]  [address] [place] [address] [phone]  [facility]  [address] [place] [address] [phone]  [facility]  [address] [place] [address] [phone]  [facility]  [address], [place] [address] [phone]  [facility]  [address], [place] [address] [phone]  [facility]  [address], [place] [address] [phone]  [facility]  [address], [place] [address] [phone]  [facility]  [address] [place], [place] [address] [phone]  [facility]  [address], [place] [address] [phone]  [facility]  [address] [place] [address] [phone]  [facility]  [address] [place] [address] [phone]  [facility]  [address], [place] [address] [phone]  [facility]  [address] [place] [address] [phone]  [facility]  [address], [place] [address] [phone]    Situation ongoing, CSW to continue following and update chart as more information becomes available.      Cathie Beams, [place]  07/25/2022 3:49 PM

## 2022-07-26 DIAGNOSIS — F319 Bipolar disorder, unspecified: Secondary | ICD-10-CM | POA: Diagnosis not present

## 2022-07-26 DIAGNOSIS — Z419 Encounter for procedure for purposes other than remedying health state, unspecified: Secondary | ICD-10-CM | POA: Diagnosis not present

## 2022-07-26 DIAGNOSIS — M792 Neuralgia and neuritis, unspecified: Secondary | ICD-10-CM | POA: Diagnosis not present

## 2022-07-26 DIAGNOSIS — Z62810 Personal history of physical and sexual abuse in childhood: Secondary | ICD-10-CM | POA: Diagnosis not present

## 2022-07-26 DIAGNOSIS — R45851 Suicidal ideations: Secondary | ICD-10-CM | POA: Diagnosis not present

## 2022-07-26 DIAGNOSIS — F17210 Nicotine dependence, cigarettes, uncomplicated: Secondary | ICD-10-CM | POA: Diagnosis not present

## 2022-07-26 DIAGNOSIS — F1424 Cocaine dependence with cocaine-induced mood disorder: Secondary | ICD-10-CM | POA: Diagnosis not present

## 2022-07-26 DIAGNOSIS — F339 Major depressive disorder, recurrent, unspecified: Secondary | ICD-10-CM | POA: Diagnosis not present

## 2022-07-26 DIAGNOSIS — K859 Acute pancreatitis without necrosis or infection, unspecified: Secondary | ICD-10-CM | POA: Diagnosis not present

## 2022-07-26 DIAGNOSIS — F1210 Cannabis abuse, uncomplicated: Secondary | ICD-10-CM | POA: Diagnosis not present

## 2022-07-26 DIAGNOSIS — R4584 Anhedonia: Secondary | ICD-10-CM | POA: Diagnosis not present

## 2022-07-26 DIAGNOSIS — F419 Anxiety disorder, unspecified: Secondary | ICD-10-CM

## 2022-07-26 DIAGNOSIS — F32A Depression, unspecified: Secondary | ICD-10-CM | POA: Diagnosis not present

## 2022-07-26 DIAGNOSIS — J45909 Unspecified asthma, uncomplicated: Secondary | ICD-10-CM | POA: Diagnosis not present

## 2022-07-26 DIAGNOSIS — Z91411 Personal history of adult psychological abuse: Secondary | ICD-10-CM | POA: Diagnosis not present

## 2022-07-26 DIAGNOSIS — L0390 Cellulitis, unspecified: Secondary | ICD-10-CM | POA: Diagnosis not present

## 2022-07-26 DIAGNOSIS — G4700 Insomnia, unspecified: Secondary | ICD-10-CM | POA: Diagnosis not present

## 2022-07-26 LAB — GC/CHLAMYDIA PROBE AMP (~~LOC~~) NOT AT ARMC
Chlamydia: NEGATIVE
Comment: NEGATIVE
Comment: NORMAL
Neisseria Gonorrhea: NEGATIVE

## 2022-07-26 LAB — RPR: RPR Ser Ql: NONREACTIVE

## 2022-07-26 LAB — HEMOGLOBIN A1C
Hgb A1c MFr Bld: 5.7 % — ABNORMAL HIGH (ref 4.8–5.6)
Mean Plasma Glucose: 117 mg/dL

## 2022-07-26 MED ORDER — ALBUTEROL SULFATE HFA 108 (90 BASE) MCG/ACT IN AERS
1.0000 | INHALATION_SPRAY | Freq: Four times a day (QID) | RESPIRATORY_TRACT | Status: DC | PRN
  Administered 2022-07-26: 2 via RESPIRATORY_TRACT
  Filled 2022-07-26: qty 6.7

## 2022-07-26 NOTE — ED Notes (Signed)
Patient was discharged to [facility]. Patient denied SI/HI and AVH. Patient was given her belongings including the $100 that she brought in. Writer gave [employer] her paperwork. [facility] is expecting patient.

## 2022-07-26 NOTE — ED Notes (Signed)
Pt sleeping at present, no distress noted.  Monitoring for safety. 

## 2022-07-26 NOTE — Discharge Instructions (Addendum)
Transfer to [place] 
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
## 2022-07-26 NOTE — ED Notes (Signed)
Patient was given her inhaler due to complaining of sob. Patient is breathing non labored and oxygen is 100 % room air.

## 2022-07-26 NOTE — ED Notes (Signed)
Patient states that she was having sob . Notified provider . Oxygen was 100 % on room air and provider is present .

## 2022-07-26 NOTE — ED Notes (Signed)
Patient alert and oriented x 3. Denies SI/HI/AVH. Denies intent or plan to harm self or others. Routine conducted according to faculty protocol. Encourage patient to notify staff with any needs or concerns. Patient verbalized agreement and understanding. Will continue to monitor for safety. 

## 2022-07-26 NOTE — ED Notes (Signed)
Patient is scheduled to discharge to [facility]. Patient was informed that she will be transported via [employer]. [facility] has Web designer that patient will be picked up around 12' noon. Patient has received her morning medications and ate breakfast. All of patient's transfer paperwork has printed and prepared to be sent with patient including her EMTALA.

## 2022-07-31 ENCOUNTER — Encounter: Payer: 59 | Admitting: Obstetrics

## 2022-08-01 DIAGNOSIS — F339 Major depressive disorder, recurrent, unspecified: Secondary | ICD-10-CM | POA: Diagnosis not present

## 2022-08-01 DIAGNOSIS — F1424 Cocaine dependence with cocaine-induced mood disorder: Secondary | ICD-10-CM | POA: Diagnosis not present

## 2022-08-01 DIAGNOSIS — R4584 Anhedonia: Secondary | ICD-10-CM | POA: Diagnosis not present

## 2022-08-01 DIAGNOSIS — M792 Neuralgia and neuritis, unspecified: Secondary | ICD-10-CM | POA: Diagnosis not present

## 2022-08-01 DIAGNOSIS — F1210 Cannabis abuse, uncomplicated: Secondary | ICD-10-CM | POA: Diagnosis not present

## 2022-08-01 DIAGNOSIS — J45909 Unspecified asthma, uncomplicated: Secondary | ICD-10-CM | POA: Diagnosis not present

## 2022-08-01 DIAGNOSIS — F319 Bipolar disorder, unspecified: Secondary | ICD-10-CM | POA: Diagnosis not present

## 2022-08-01 DIAGNOSIS — F17210 Nicotine dependence, cigarettes, uncomplicated: Secondary | ICD-10-CM | POA: Diagnosis not present

## 2022-08-01 DIAGNOSIS — K859 Acute pancreatitis without necrosis or infection, unspecified: Secondary | ICD-10-CM | POA: Diagnosis not present

## 2022-08-01 DIAGNOSIS — R45851 Suicidal ideations: Secondary | ICD-10-CM | POA: Diagnosis not present

## 2022-08-01 DIAGNOSIS — L0390 Cellulitis, unspecified: Secondary | ICD-10-CM | POA: Diagnosis not present

## 2022-08-01 DIAGNOSIS — G4700 Insomnia, unspecified: Secondary | ICD-10-CM | POA: Diagnosis not present

## 2022-08-05 DIAGNOSIS — F1420 Cocaine dependence, uncomplicated: Secondary | ICD-10-CM | POA: Diagnosis not present

## 2022-08-06 DIAGNOSIS — F1420 Cocaine dependence, uncomplicated: Secondary | ICD-10-CM | POA: Diagnosis not present

## 2022-08-07 DIAGNOSIS — F1420 Cocaine dependence, uncomplicated: Secondary | ICD-10-CM | POA: Diagnosis not present

## 2022-08-07 DIAGNOSIS — F1410 Cocaine abuse, uncomplicated: Secondary | ICD-10-CM | POA: Diagnosis not present

## 2022-09-06 ENCOUNTER — Encounter: Payer: 59 | Admitting: Obstetrics and Gynecology

## 2022-10-09 ENCOUNTER — Ambulatory Visit (INDEPENDENT_AMBULATORY_CARE_PROVIDER_SITE_OTHER): Payer: 59 | Admitting: Primary Care

## 2022-10-22 ENCOUNTER — Telehealth (INDEPENDENT_AMBULATORY_CARE_PROVIDER_SITE_OTHER): Payer: Self-pay | Admitting: Primary Care

## 2022-10-23 ENCOUNTER — Encounter (INDEPENDENT_AMBULATORY_CARE_PROVIDER_SITE_OTHER): Payer: 59 | Admitting: Primary Care

## 2022-11-01 ENCOUNTER — Ambulatory Visit (INDEPENDENT_AMBULATORY_CARE_PROVIDER_SITE_OTHER): Payer: 59 | Admitting: Primary Care

## 2022-11-01 ENCOUNTER — Other Ambulatory Visit (HOSPITAL_COMMUNITY)
Admission: RE | Admit: 2022-11-01 | Discharge: 2022-11-01 | Disposition: A | Discharge status: Home / Self Care | Payer: 59 | Source: Ambulatory Visit | Attending: Primary Care | Admitting: Primary Care

## 2022-11-01 DIAGNOSIS — N760 Acute vaginitis: Secondary | ICD-10-CM | POA: Insufficient documentation

## 2022-11-01 DIAGNOSIS — G8929 Other chronic pain: Secondary | ICD-10-CM

## 2022-11-01 DIAGNOSIS — Z124 Encounter for screening for malignant neoplasm of cervix: Secondary | ICD-10-CM | POA: Insufficient documentation

## 2022-11-01 DIAGNOSIS — M545 Low back pain, unspecified: Secondary | ICD-10-CM | POA: Diagnosis not present

## 2022-11-01 DIAGNOSIS — F4323 Adjustment disorder with mixed anxiety and depressed mood: Secondary | ICD-10-CM

## 2022-11-01 DIAGNOSIS — Z1211 Encounter for screening for malignant neoplasm of colon: Secondary | ICD-10-CM | POA: Diagnosis not present

## 2022-11-01 DIAGNOSIS — B9689 Other specified bacterial agents as the cause of diseases classified elsewhere: Secondary | ICD-10-CM | POA: Diagnosis not present

## 2022-11-01 DIAGNOSIS — G4700 Insomnia, unspecified: Secondary | ICD-10-CM | POA: Diagnosis not present

## 2022-11-01 DIAGNOSIS — Z01419 Encounter for gynecological examination (general) (routine) without abnormal findings: Secondary | ICD-10-CM | POA: Insufficient documentation

## 2022-11-01 DIAGNOSIS — Z1231 Encounter for screening mammogram for malignant neoplasm of breast: Secondary | ICD-10-CM

## 2022-11-01 DIAGNOSIS — Z113 Encounter for screening for infections with a predominantly sexual mode of transmission: Secondary | ICD-10-CM | POA: Diagnosis not present

## 2022-11-01 DIAGNOSIS — Z1151 Encounter for screening for human papillomavirus (HPV): Secondary | ICD-10-CM | POA: Insufficient documentation

## 2022-11-01 MED ORDER — HYDROXYZINE PAMOATE 25 MG PO CAPS
25.0000 mg | ORAL_CAPSULE | Freq: Three times a day (TID) | ORAL | 1 refills | Status: DC | PRN
Start: 2022-11-01 — End: 2023-04-18

## 2022-11-01 MED ORDER — TRAZODONE HCL 50 MG PO TABS: 50.0000 mg | ORAL_TABLET | Freq: Every evening | ORAL | 1 refills | Status: DC | PRN

## 2022-11-01 MED ORDER — GABAPENTIN 100 MG PO CAPS
100.0000 mg | ORAL_CAPSULE | Freq: Two times a day (BID) | ORAL | 1 refills | Status: DC
Start: 2022-11-01 — End: 2023-04-18

## 2022-11-01 NOTE — Progress Notes (Signed)
[facility]  WELL-WOMAN PHYSICAL & PAP Patient name: Tricia Hood MRN [id number]  Date of birth: 07-20-1970 Chief Complaint:   Annual Exam and Gynecologic Exam  History of Present Illness:   Tricia Hood is a 52 y.o. No obstetric history on file. female being seen today for a routine well-woman exam.   CC:gyn    The current method of family planning is tubal ligation.  No LMP recorded. Patient is perimenopausal. Last pap unknown .  Last mammogram: unknown.Family h/o breast cancer: aunt maternal  Last colonoscopy: never .  Family h/o colorectal cancer: Yes  Review of Systems:    Denies any headaches, blurred vision, fatigue, shortness of breath, chest pain, abdominal pain, abnormal vaginal discharge/itching/odor/irritation, problems with periods, bowel movements, urination, or intercourse unless otherwise stated above.  Pertinent History Reviewed:   Reviewed past medical,surgical, social and family history.  Reviewed problem list, medications and allergies.  Physical Assessment:  There were no vitals filed for this visit.There is no height or weight on file to calculate BMI.        Physical Examination:  General appearance - well appearing, and in no distress Mental status - alert, oriented to person, place, and time Psych:  She has a normal mood and affect Skin - warm and dry, normal color, no suspicious lesions noted Chest - effort normal, all lung fields clear to auscultation bilaterally Heart - normal rate and regular rhythm Neck:  midline trachea, no thyromegaly or nodules Breasts - breasts appear normal, no suspicious masses, no skin or nipple changes or axillary nodes Educated patient on proper self breast examination and had patient to demonstrate SBE. Abdomen - soft, nontender, nondistended, no masses or organomegaly Pelvic-VULVA: normal appearing vulva with no masses, tenderness or lesions   VAGINA: normal appearing vagina with normal  color and discharge, no lesions   CERVIX: normal appearing cervix without discharge or lesions, no CMT UTERUS: uterus is felt to be normal size, shape, consistency and nontender  ADNEXA: No adnexal masses or tenderness noted. Extremities:  No swelling or varicosities noted  No results found for this or any previous visit (from the past 24 hour(s)).   Assessment & Plan:  Anyely was seen today for annual exam and gynecologic exam.  Diagnoses and all orders for this visit:  Cervical cancer screening -     Cervicovaginal ancillary only -     Cytology - PAP  Adjustment disorder with mixed anxiety and depressed mood -     gabapentin (NEURONTIN) 100 MG capsule; Take 1 capsule (100 mg total) by mouth 2 (two) times daily. -     hydrOXYzine (VISTARIL) 25 MG capsule; Take 1 capsule (25 mg total) by mouth every 8 (eight) hours as needed.  Encounter for screening mammogram for malignant neoplasm of breast -     MM DIGITAL SCREENING BILATERAL; Future  Chronic bilateral low back pain, unspecified whether sciatica present -     Ambulatory referral to Orthopedic Surgery  Insomnia, unspecified type  traZODone (DESYREL) 50 MG tablet; Take 1-2 tablets (50-100 mg total) by mouth at bedtime as needed.  Colon cancer screening -     Ambulatory referral to Gastroenterology  Other orders -     traZODone (DESYREL) 50 MG tablet; Take 1-2 tablets (50-100 mg total) by mouth at bedtime as needed.    This note has been created with Education officer, environmental. Any transcriptional errors are unintentional.   Grayce Sessions, NP 11/01/2022, 11:03  AM

## 2022-11-05 ENCOUNTER — Ambulatory Visit (INDEPENDENT_AMBULATORY_CARE_PROVIDER_SITE_OTHER): Payer: 59 | Admitting: Physical Medicine and Rehabilitation

## 2022-11-05 ENCOUNTER — Encounter: Payer: Self-pay | Admitting: Physical Medicine and Rehabilitation

## 2022-11-05 VITALS — BP 166/112 | HR 81

## 2022-11-05 DIAGNOSIS — M545 Low back pain, unspecified: Secondary | ICD-10-CM

## 2022-11-05 DIAGNOSIS — M47816 Spondylosis without myelopathy or radiculopathy, lumbar region: Secondary | ICD-10-CM | POA: Diagnosis not present

## 2022-11-05 DIAGNOSIS — G8929 Other chronic pain: Secondary | ICD-10-CM

## 2022-11-05 DIAGNOSIS — M7918 Myalgia, other site: Secondary | ICD-10-CM

## 2022-11-05 MED ORDER — DIAZEPAM 5 MG PO TABS
ORAL_TABLET | ORAL | 0 refills | Status: DC
Start: 2022-11-05 — End: 2022-11-06

## 2022-11-05 NOTE — Progress Notes (Unsigned)
Functional Pain Scale - descriptive words and definitions  Intense (8)    Cannot complete any ADLs without much assistance/cannot concentrate/conversation is difficult/unable to sleep and unable to use distraction. Severe range order  Average Pain 8  Pain in middle lower back, pain in right leg and both hips. Been out of work 8 month. Hurts to stand, very uncomfortable all the time and has difficulty sleeping. Has tried PT.

## 2022-11-05 NOTE — Progress Notes (Unsigned)
Tricia Hood - 52 y.o. female MRN [id number]  Date of birth: 11/16/1970  Office Visit Note: Visit Date: 11/05/2022 PCP: Grayce Sessions, NP Referred by: Grayce Sessions, NP  Subjective: Chief Complaint  Patient presents with   Lower Back - Pain   Left Hip - Pain   Right Hip - Pain   Right Leg - Pain   HPI: Tricia Hood is a 52 y.o. female who comes in today per the request of Gwinda Passe, NP for evaluation of chronic, worsening and severe bilateral lower back pain radiating to hips and intermittently down right leg. 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 Her pain has gradually worsened over the years and becomes severe with movement and activity. She describes her pain as sharp and stabbing sensation. States pain becomes excruciating when she touches lower back and legs. She has tried home exercise regimen, gabapentin and flexeril with no relief of pain. She is currently being treated for chronic pain by Roma Kayser, PA with [facility], planning on scheduling follow up appointment soon. No relief of pain with prior history of formal physical therapy. Lumbar MRI imaging from 2022 exhibits bilateral facet degeneration at L3-L4, there is mild subarticular stenosis at L2-L3 and L3-L4. No high grade spinal canal stenosis noted. Patient was previously evaluated by Dr. Hoyt Koch with [facility] in 2022, no surgical intervention recommended at that time. No history of lumbar injection. Patient states she has not worked in 8 months due to chronic back issues. Patient denies focal weakness, numbness and tingling. No recent trauma or falls.   Of note, she reports diffuse pain to entire body, states pain to neck, shoulders, upper back, legs and feet. States her pain becomes worse with light palpation.      {Oswestry Disability Score:26558}  Review of Systems  Musculoskeletal:  Positive for back pain, joint  pain, myalgias and neck pain.  Neurological:  Negative for tingling, sensory change, focal weakness and weakness.  All other systems reviewed and are negative.  Otherwise per HPI.  Assessment & Plan: Visit Diagnoses:    ICD-10-CM   1. Chronic bilateral low back pain without sciatica  M54.50 Ambulatory referral to Physical Medicine Rehab   G89.29     2. Facet arthropathy, lumbar  M47.816 Ambulatory referral to Physical Medicine Rehab    3. Myofascial pain syndrome  M79.18 Ambulatory referral to Physical Medicine Rehab       Plan: Findings:  Chronic, worsening and severe bilateral lower back pain radiating to hips and intermittently down right leg. Patient continues to have severe pain despite good conservative therapies such as formal physical therapy, home exercise regimen, rest and use of medications. Prior lumbar MRI imaging does not directly correlate with her symptoms, lower back pain could be facet mediated however there is no spine related issue that would explain her diffuse pain. Patient is tender upon palpation of lower back, hips and legs, this seems to be some type of pain syndrome/central sensitization disorder such as fibromyalgia. We discussed treatment plan in detail today, next step is to perform bilateral L3-L4 facet joint injections under fluoroscopic guidance. Dr. Alvester Morin at bedside to discuss injection procedure. She does voice anxiety related to injection procedure, I did prescribe pre-procedure Valium for her to take on day of injection. I encouraged patient to speak with PCP regarding chronic diffuse body pain. No red flag symptoms noted upon exam today.     Meds &  Orders:  Meds ordered this encounter  Medications   diazepam (VALIUM) 5 MG tablet    Sig: Take one tablet by mouth with food one hour prior to procedure. May repeat 30 minutes prior if needed.    Dispense:  2 tablet    Refill:  0    Orders Placed This Encounter  Procedures   Ambulatory referral to  Physical Medicine Rehab    Follow-up: Return for Bilateral L3-L4 facet joint injections.   Procedures: No procedures performed      Clinical History: MRI LUMBAR SPINE WITHOUT CONTRAST  TECHNIQUE: Multiplanar, multisequence MR imaging of the lumbar spine was performed. No intravenous contrast was administered.  COMPARISON: None.  FINDINGS: Segmentation: Normal  Alignment: Normal  Vertebrae: Normal bone marrow. Negative for fracture or mass.  Conus medullaris and cauda equina: Conus extends to the L1 level. Conus and cauda equina appear normal.  Paraspinal and other soft tissues: Negative for paraspinous mass or adenopathy.  Disc levels:  T11-12: Minimal central disc protrusion without stenosis  T12-L1: Negative  L1-2: Negative  L2-3: Disc degeneration with diffuse disc bulging. Shallow subarticular disc protrusion bilaterally. Associated mild spurring on the left. Subarticular stenosis left greater than right. Spinal canal adequate in size.  L3-4: Disc degeneration with mild disc bulging. Bilateral facet degeneration. Mild subarticular stenosis right greater than left  L4-5: Negative  L5-S1: Negative  IMPRESSION: Shallow disc protrusions bilaterally at L2-3 with subarticular stenosis bilaterally left greater than right.  Disc bulging at L3-4 with mild subarticular stenosis bilaterally right greater than left.   Electronically Signed By: Marlan Palau M.D. On: 07/11/2020 14:16   She reports that she has been smoking cigarettes. She has never used smokeless tobacco.  Recent Labs    07/25/22 1308  HGBA1C 5.7*    Objective:  VS:  HT:    WT:   BMI:     BP:(!) 166/112  HR:81bpm  TEMP: ( )  RESP:  Physical Exam Vitals and nursing note reviewed.  HENT:     Head: Normocephalic and atraumatic.     Right Ear: External ear normal.     Left Ear: External ear normal.     Nose: Nose normal.     Mouth/Throat:     Mouth: Mucous membranes are moist.   Eyes:     Extraocular Movements: Extraocular movements intact.  Cardiovascular:     Rate and Rhythm: Normal rate.     Pulses: Normal pulses.  Pulmonary:     Effort: Pulmonary effort is normal.  Abdominal:     General: Abdomen is flat. There is no distension.  Musculoskeletal:        General: Tenderness present.     Cervical back: Normal range of motion.     Comments: Patient rises from seated position to standing without difficulty. Pain noted upon facet loading. 5/5 strength noted with bilateral hip flexion, knee flexion/extension, ankle dorsiflexion/plantarflexion and EHL. No clonus noted bilaterally. No pain upon palpation of greater trochanters. No pain with internal/external rotation of bilateral hips. Sensation intact bilaterally. Tenderness noted upon palpation of lumbar paraspinal regions, hips and legs. Negative slump test bilaterally. Ambulates without aid, gait steady.     Skin:    General: Skin is warm and dry.     Capillary Refill: Capillary refill takes less than 2 seconds.  Neurological:     General: No focal deficit present.     Mental Status: She is alert and oriented to person, place, and time.  Psychiatric:  Mood and Affect: Mood normal.        Behavior: Behavior normal.     Ortho Exam  Imaging: No results found.  Past Medical/Family/Surgical/Social History: Medications & Allergies reviewed per EMR, new medications updated. Patient Active Problem List   Diagnosis Date Noted   Pancreatitis 10/08/2014   Cellulitis 10/07/2014   Insect bite 10/07/2014   Asthma 10/07/2014   Abdominal pain 10/07/2014   Hypokalemia 10/07/2014   Past Medical History:  Diagnosis Date   Asthma    Family History  Problem Relation Age of Onset   Hypertension Mother    Past Surgical History:  Procedure Laterality Date   ANKLE FRACTURE SURGERY     TONSILLECTOMY     TUBAL LIGATION     Social History   Occupational History   Not on file  Tobacco Use   Smoking  status: Every Day    Current packs/day: 1.00    Types: Cigarettes   Smokeless tobacco: Never  Vaping Use   Vaping status: Never Used  Substance and Sexual Activity   Alcohol use: No   Drug use: No   Sexual activity: Yes    Birth control/protection: Surgical

## 2022-11-06 ENCOUNTER — Other Ambulatory Visit (INDEPENDENT_AMBULATORY_CARE_PROVIDER_SITE_OTHER): Payer: Self-pay | Admitting: Primary Care

## 2022-11-06 ENCOUNTER — Telehealth: Payer: Self-pay | Admitting: Physical Medicine and Rehabilitation

## 2022-11-06 LAB — CERVICOVAGINAL ANCILLARY ONLY
Bacterial Vaginitis (gardnerella): POSITIVE — AB
Candida Glabrata: NEGATIVE
Candida Vaginitis: NEGATIVE
Chlamydia: NEGATIVE
Comment: NEGATIVE
Comment: NEGATIVE
Comment: NEGATIVE
Comment: NEGATIVE
Comment: NEGATIVE
Comment: NORMAL
Neisseria Gonorrhea: NEGATIVE
Trichomonas: NEGATIVE

## 2022-11-06 MED ORDER — DIAZEPAM 5 MG PO TABS: ORAL_TABLET | ORAL | 0 refills | Status: AC

## 2022-11-06 NOTE — Telephone Encounter (Signed)
Requested medication (s) are due for refill today: resend to another pharmacy  Requested medication (s) are on the active medication list: yes  Last refill:  11/05/22  Future visit scheduled: yes  Notes to clinic:  Unable to refill per protocol, cannot delegate.      Requested Prescriptions  Pending Prescriptions Disp Refills   diazepam (VALIUM) 5 MG tablet 2 tablet 0    Sig: Take one tablet by mouth with food one hour prior to procedure. May repeat 30 minutes prior if needed.     Not Delegated - Psychiatry: Anxiolytics/Hypnotics 2 Failed - 11/06/2022 12:30 PM      Failed - This refill cannot be delegated      Failed - Urine Drug Screen completed in last 360 days      Passed - Patient is not pregnant      Passed - Valid encounter within last 6 months    Recent Outpatient Visits           5 days ago Cervical cancer screening   [place] [facility] Grayce Sessions, NP   3 months ago Encounter to establish care   [place] [facility] Grayce Sessions, NP   9 years ago Itching   [facility], Newt Lukes, MD       Future Appointments             In 1 week Randa Evens Kinnie Scales, NP [facility]

## 2022-11-06 NOTE — Telephone Encounter (Signed)
[facility]

## 2022-11-06 NOTE — Addendum Note (Signed)
Addended by: Ashok Norris on: 11/06/2022 02:52 PM   Modules accepted: Orders

## 2022-11-06 NOTE — Telephone Encounter (Signed)
Patient called stated she did not want her diazepam (VALIUM) 5 MG tablet to go [facility] she needed it to go to  [id number] [place],  - [address] AT [facility] OF [address] Phone: [phone]  Fax: [phone]    Please f/u with patient

## 2022-11-06 NOTE — Telephone Encounter (Signed)
Patient called and needed [facility] [address]. Her insurance don't support [facility]. ZO#[phone]

## 2022-11-08 ENCOUNTER — Other Ambulatory Visit (INDEPENDENT_AMBULATORY_CARE_PROVIDER_SITE_OTHER): Payer: Self-pay | Admitting: Primary Care

## 2022-11-08 ENCOUNTER — Telehealth (INDEPENDENT_AMBULATORY_CARE_PROVIDER_SITE_OTHER): Payer: Self-pay

## 2022-11-08 DIAGNOSIS — B9689 Other specified bacterial agents as the cause of diseases classified elsewhere: Secondary | ICD-10-CM

## 2022-11-08 MED ORDER — METRONIDAZOLE 500 MG PO TABS
500.0000 mg | ORAL_TABLET | Freq: Two times a day (BID) | ORAL | 0 refills | Status: DC
Start: 2022-11-08 — End: 2022-12-29

## 2022-11-08 NOTE — Telephone Encounter (Signed)
Contacted pt to go over lab results pt is aware and doesn't have any questions or concerns 

## 2022-11-12 LAB — CYTOLOGY - PAP
Comment: NEGATIVE
Diagnosis: NEGATIVE
High risk HPV: NEGATIVE

## 2022-11-13 ENCOUNTER — Ambulatory Visit (INDEPENDENT_AMBULATORY_CARE_PROVIDER_SITE_OTHER): Payer: 59 | Admitting: Primary Care

## 2022-11-15 ENCOUNTER — Telehealth (INDEPENDENT_AMBULATORY_CARE_PROVIDER_SITE_OTHER): Payer: Self-pay

## 2022-11-15 ENCOUNTER — Telehealth: Payer: Self-pay | Admitting: Physical Medicine and Rehabilitation

## 2022-11-15 NOTE — Telephone Encounter (Signed)
Patient returned call asked for a call back to schedule her appointment. The number to contact patient is [phone]

## 2022-11-15 NOTE — Telephone Encounter (Signed)
Contacted pt to go over lab results pt is aware and doesn't have any questions or concerns 

## 2022-11-15 NOTE — Telephone Encounter (Signed)
Spoke with patient and scheduled injection for 11/20/22. Patient aware driver needed

## 2022-11-20 ENCOUNTER — Encounter: Payer: Self-pay | Admitting: Physical Medicine and Rehabilitation

## 2022-11-20 ENCOUNTER — Ambulatory Visit (INDEPENDENT_AMBULATORY_CARE_PROVIDER_SITE_OTHER): Payer: 59 | Admitting: Physical Medicine and Rehabilitation

## 2022-11-20 ENCOUNTER — Other Ambulatory Visit: Payer: Self-pay

## 2022-11-20 VITALS — BP 149/83 | HR 96

## 2022-11-20 DIAGNOSIS — M47816 Spondylosis without myelopathy or radiculopathy, lumbar region: Secondary | ICD-10-CM | POA: Diagnosis not present

## 2022-11-20 MED ORDER — METHYLPREDNISOLONE ACETATE 40 MG/ML IJ SUSP
40.0000 mg | Freq: Once | INTRAMUSCULAR | Status: AC
Start: 2022-11-20 — End: 2022-11-20
  Administered 2022-11-20: 40 mg

## 2022-11-20 NOTE — Progress Notes (Signed)
Functional Pain Scale - descriptive words and definitions  Distressing (6)    Pain is present/unable to complete most ADLs limited by pain/sleep is difficult and active distraction is only marginal. Moderate range order  Average Pain  varies   +Driver, -BT, -Dye Allergies.  Lower back pain on both sides that radiates into the hips

## 2022-11-25 ENCOUNTER — Ambulatory Visit (INDEPENDENT_AMBULATORY_CARE_PROVIDER_SITE_OTHER): Payer: 59 | Admitting: Primary Care

## 2022-11-25 ENCOUNTER — Telehealth (INDEPENDENT_AMBULATORY_CARE_PROVIDER_SITE_OTHER): Payer: Self-pay | Admitting: Primary Care

## 2022-11-27 NOTE — Procedures (Signed)
Lumbar Facet Joint Intra-Articular Injection(s) with Fluoroscopic Guidance  Patient: Tricia Hood      Date of Birth: 02/03/1970 MRN: [id number] PCP: Grayce Sessions, NP      Visit Date: 11/20/2022   Universal Protocol:    Date/Time: 11/20/2022  Consent Given By: the patient  Position: PRONE   Additional Comments: Vital signs were monitored before and after the procedure. Patient was prepped and draped in the usual sterile fashion. The correct patient, procedure, and site was verified.   Injection Procedure Details:  Procedure Site One Meds Administered:  Meds ordered this encounter  Medications   methylPREDNISolone acetate (DEPO-MEDROL) injection 40 mg     Laterality: Bilateral  Location/Site:  L3-L4  Needle size: 22 guage  Needle type: Spinal  Needle Placement: Articular  Findings:  -Comments: Excellent flow of contrast producing a partial arthrogram.  Procedure Details: The fluoroscope beam is vertically oriented in AP, and the inferior recess is visualized beneath the lower pole of the inferior apophyseal process, which represents the target point for needle insertion. When direct visualization is difficult the target point is located at the medial projection of the vertebral pedicle. The region overlying each aforementioned target is locally anesthetized with a 1 to 2 ml. volume of 1% Lidocaine without Epinephrine.   The spinal needle was inserted into each of the above mentioned facet joints using biplanar fluoroscopic guidance. A 0.25 to 0.5 ml. volume of Isovue-250 was injected and a partial facet joint arthrogram was obtained. A single spot film was obtained of the resulting arthrogram.    One to 1.25 ml of the steroid/anesthetic solution was then injected into each of the facet joints noted above.   Additional Comments:  The patient tolerated the procedure well Dressing: 2 x 2 sterile gauze and Band-Aid    Post-procedure details: Patient was  observed during the procedure. Post-procedure instructions were reviewed.  Patient left the clinic in stable condition.

## 2022-11-27 NOTE — Progress Notes (Signed)
Tricia Hood - 52 y.o. female MRN [id number]  Date of birth: 01/25/1971  Office Visit Note: Visit Date: 11/20/2022 PCP: Grayce Sessions, NP Referred by: Grayce Sessions, NP  Subjective: Chief Complaint  Patient presents with   Lower Back - Pain   HPI:  Tricia Hood is a 52 y.o. female who comes in today at the request of Ellin Goodie, FNP for planned Bilateral  L3-4 Lumbar facet/medial branch block with fluoroscopic guidance.  The patient has failed conservative care including home exercise, medications, time and activity modification.  This injection will be diagnostic and hopefully therapeutic.  Please see requesting physician notes for further details and justification.  Exam has shown concordant pain with facet joint loading.   ROS Otherwise per HPI.  Assessment & Plan: Visit Diagnoses:    ICD-10-CM   1. Spondylosis without myelopathy or radiculopathy, lumbar region  M47.816 XR C-ARM NO REPORT    Facet Injection    methylPREDNISolone acetate (DEPO-MEDROL) injection 40 mg      Plan: No additional findings.   Meds & Orders:  Meds ordered this encounter  Medications   methylPREDNISolone acetate (DEPO-MEDROL) injection 40 mg    Orders Placed This Encounter  Procedures   Facet Injection   XR C-ARM NO REPORT    Follow-up: Return for visit to requesting provider as needed.   Procedures: No procedures performed  Lumbar Facet Joint Intra-Articular Injection(s) with Fluoroscopic Guidance  Patient: Tricia Hood      Date of Birth: 09/27/1970 MRN: [id number] PCP: Grayce Sessions, NP      Visit Date: 11/20/2022   Universal Protocol:    Date/Time: 11/20/2022  Consent Given By: the patient  Position: PRONE   Additional Comments: Vital signs were monitored before and after the procedure. Patient was prepped and draped in the usual sterile fashion. The correct patient, procedure, and site was verified.   Injection Procedure Details:   Procedure Site One Meds Administered:  Meds ordered this encounter  Medications   methylPREDNISolone acetate (DEPO-MEDROL) injection 40 mg     Laterality: Bilateral  Location/Site:  L3-L4  Needle size: 22 guage  Needle type: Spinal  Needle Placement: Articular  Findings:  -Comments: Excellent flow of contrast producing a partial arthrogram.  Procedure Details: The fluoroscope beam is vertically oriented in AP, and the inferior recess is visualized beneath the lower pole of the inferior apophyseal process, which represents the target point for needle insertion. When direct visualization is difficult the target point is located at the medial projection of the vertebral pedicle. The region overlying each aforementioned target is locally anesthetized with a 1 to 2 ml. volume of 1% Lidocaine without Epinephrine.   The spinal needle was inserted into each of the above mentioned facet joints using biplanar fluoroscopic guidance. A 0.25 to 0.5 ml. volume of Isovue-250 was injected and a partial facet joint arthrogram was obtained. A single spot film was obtained of the resulting arthrogram.    One to 1.25 ml of the steroid/anesthetic solution was then injected into each of the facet joints noted above.   Additional Comments:  The patient tolerated the procedure well Dressing: 2 x 2 sterile gauze and Band-Aid    Post-procedure details: Patient was observed during the procedure. Post-procedure instructions were reviewed.  Patient left the clinic in stable condition.    Clinical History: MRI LUMBAR SPINE WITHOUT CONTRAST  TECHNIQUE: Multiplanar, multisequence MR imaging of the lumbar spine was performed. No intravenous contrast was administered.  COMPARISON: None.  FINDINGS: Segmentation: Normal  Alignment: Normal  Vertebrae: Normal bone marrow. Negative for fracture or mass.  Conus medullaris and cauda equina: Conus extends to the L1 level. Conus and cauda equina  appear normal.  Paraspinal and other soft tissues: Negative for paraspinous mass or adenopathy.  Disc levels:  T11-12: Minimal central disc protrusion without stenosis  T12-L1: Negative  L1-2: Negative  L2-3: Disc degeneration with diffuse disc bulging. Shallow subarticular disc protrusion bilaterally. Associated mild spurring on the left. Subarticular stenosis left greater than right. Spinal canal adequate in size.  L3-4: Disc degeneration with mild disc bulging. Bilateral facet degeneration. Mild subarticular stenosis right greater than left  L4-5: Negative  L5-S1: Negative  IMPRESSION: Shallow disc protrusions bilaterally at L2-3 with subarticular stenosis bilaterally left greater than right.  Disc bulging at L3-4 with mild subarticular stenosis bilaterally right greater than left.   Electronically Signed By: Marlan Palau M.D. On: 07/11/2020 14:16     Objective:  VS:  HT:    WT:   BMI:     BP:(!) 149/83  HR:96bpm  TEMP: ( )  RESP:  Physical Exam Vitals and nursing note reviewed.  Constitutional:      General: She is not in acute distress.    Appearance: Normal appearance. She is not ill-appearing.  HENT:     Head: Normocephalic and atraumatic.     Right Ear: External ear normal.     Left Ear: External ear normal.  Eyes:     Extraocular Movements: Extraocular movements intact.  Cardiovascular:     Rate and Rhythm: Normal rate.     Pulses: Normal pulses.  Pulmonary:     Effort: Pulmonary effort is normal. No respiratory distress.  Abdominal:     General: There is no distension.     Palpations: Abdomen is soft.  Musculoskeletal:        General: Tenderness present.     Cervical back: Neck supple.     Right lower leg: No edema.     Left lower leg: No edema.     Comments: Patient has good distal strength with no pain over the greater trochanters.  No clonus or focal weakness.  Skin:    Findings: No erythema, lesion or rash.  Neurological:      General: No focal deficit present.     Mental Status: She is alert and oriented to person, place, and time.     Sensory: No sensory deficit.     Motor: No weakness or abnormal muscle tone.     Coordination: Coordination normal.  Psychiatric:        Mood and Affect: Mood normal.        Behavior: Behavior normal.      Imaging: No results found.

## 2022-11-29 NOTE — Telephone Encounter (Signed)
Spoke to pt about upcoming apt. She will be present

## 2022-11-29 NOTE — Telephone Encounter (Signed)
Called to confirm with pt.

## 2022-12-11 ENCOUNTER — Ambulatory Visit (INDEPENDENT_AMBULATORY_CARE_PROVIDER_SITE_OTHER): Payer: 59 | Admitting: Primary Care

## 2022-12-17 NOTE — Progress Notes (Unsigned)
Psychiatric Initial Adult Assessment  Patient Identification: Tricia Hood MRN:  [id number] Date of Evaluation:  12/17/2022 Referral Source: Gwinda Passe NP  Assessment:  Tricia Hood is a 52 y.o. female with a history of *** cocaine use disorder, chronic pain who presents to [facility] via video conferencing for initial evaluation of ***.  Patient reports ***  Plan:  # *** Past medication trials:  Status of problem: *** Interventions: -- ***  # *** Past medication trials:  Status of problem: *** Interventions: -- ***  # *** Past medication trials:  Status of problem: *** Interventions: -- ***  Patient was given contact information for behavioral health clinic and was instructed to call 911 for emergencies.   Subjective:  Chief Complaint: No chief complaint on file.   History of Present Illness:  ***  Chart review: -- [facility] 07/25/22-07/26/22 BIB law enforcement for SI and depressive symptoms.  UDS + cocaine and THC. Discharged to [facility]. -- Referred by PCP October 2024 for adjustment disorder with mixed anxiety and depressed mood. -- Seen by PM&R 11/05/22 for chronic pain: concern for pain syndrome/central sensitization disorder such as fibromyalgia. S/p bilateral L3-L4 facet joint injections under fluoroscopic guidance on 11/20/22.  Home psych medications: -- Gabapentin 100 mg BID -- Atarax 25 mg Q8H PRN -- trazodone 50-100 mg nightly PRN   Substance use Mood Medical conditions   Past Psychiatric History:  Diagnoses: ***depression, anxiety, panic attacks, agoraphobia, cocaine use disorder  Medication trials: *** Previous psychiatrist/therapist: *** Hospitalizations: *** Suicide attempts: *** SIB: ***cutting in teenage years Hx of violence towards others: *** Current access to guns: *** Hx of trauma/abuse: ***  Previous Psychotropic Medications: {YES/NO:21197}  Substance Abuse History in the last 12 months:  Yes.     -- Cocaine: daily since 52 years old  -- Cannabis:   -- Etoh:   Past Medical History:  Past Medical History:  Diagnosis Date   Asthma     Past Surgical History:  Procedure Laterality Date   ANKLE FRACTURE SURGERY     TONSILLECTOMY     TUBAL LIGATION      Family Psychiatric History: ***  Family History:  Family History  Problem Relation Age of Onset   Hypertension Mother     Social History:   Academic/Vocational: ***  Social History   Socioeconomic History   Marital status: Single    Spouse name: Not on file   Number of children: Not on file   Years of education: Not on file   Highest education level: Not on file  Occupational History   Not on file  Tobacco Use   Smoking status: Every Day    Current packs/day: 1.00    Types: Cigarettes   Smokeless tobacco: Never  Vaping Use   Vaping status: Never Used  Substance and Sexual Activity   Alcohol use: No   Drug use: No   Sexual activity: Yes    Birth control/protection: Surgical  Other Topics Concern   Not on file  Social History Narrative   Not on file   Social Determinants of Health   Financial Resource Strain: Not on file  Food Insecurity: No Food Insecurity (07/25/2022)   Hunger Vital Sign    Worried About Running Out of Food in the Last Year: Never true    Ran Out of Food in the Last Year: Never true  Transportation Needs: No Transportation Needs (07/25/2022)   PRAPARE - Administrator, Civil Service (Medical): No  Lack of Transportation (Non-Medical): No  Physical Activity: Not on file  Stress: Not on file  Social Connections: Not on file    Additional Social History: updated  Allergies:   Allergies  Allergen Reactions   Nsaids Anaphylaxis and Other (See Comments)    Acetaminophen/Tylenol tolerated well per pt   Other Anaphylaxis, Hives and Other (See Comments)    Organic food   Shellfish Allergy Anaphylaxis and Hives   Citrus Rash    Current Medications: Current  Outpatient Medications  Medication Sig Dispense Refill   albuterol (VENTOLIN HFA) 108 (90 Base) MCG/ACT inhaler Inhale 1-2 puffs into the lungs every 6 (six) hours as needed for wheezing or shortness of breath. 1 each 0   ARIPiprazole (ABILIFY) 10 MG tablet Take 10 mg by mouth daily.     diazepam (VALIUM) 5 MG tablet Take one tablet by mouth with food one hour prior to procedure. May repeat 30 minutes prior if needed. 2 tablet 0   EPINEPHrine (EPIPEN 2-PAK) 0.3 mg/0.3 mL IJ SOAJ injection Inject 0.3 mLs (0.3 mg total) into the muscle as needed for anaphylaxis. 1 Device 0   gabapentin (NEURONTIN) 100 MG capsule Take 1 capsule (100 mg total) by mouth 2 (two) times daily. 180 capsule 1   hydrOXYzine (VISTARIL) 25 MG capsule Take 1 capsule (25 mg total) by mouth every 8 (eight) hours as needed. 90 capsule 1   metroNIDAZOLE (FLAGYL) 500 MG tablet Take 1 tablet (500 mg total) by mouth 2 (two) times daily. 14 tablet 0   traZODone (DESYREL) 50 MG tablet Take 1-2 tablets (50-100 mg total) by mouth at bedtime as needed. 90 tablet 1   No current facility-administered medications for this visit.    ROS: Review of Systems  Objective:  Psychiatric Specialty Exam: There were no vitals taken for this visit.There is no height or weight on file to calculate BMI.  General Appearance: {Appearance:22683}  Eye Contact:  {BHH EYE CONTACT:22684}  Speech:  {Speech:22685}  Volume:  {Volume (PAA):22686}  Mood:  {BHH MOOD:22306}  Affect:  {Affect (PAA):22687}  Thought Content: {Thought Content:22690}   Suicidal Thoughts:  {ST/HT (PAA):22692}  Homicidal Thoughts:  {ST/HT (PAA):22692}  Thought Process:  {Thought Process (PAA):22688}  Orientation:  {BHH ORIENTATION (PAA):22689}    Memory: Grossly intact ***  Judgment:  {Judgement (PAA):22694}  Insight:  {Insight (PAA):22695}  Concentration:  {Concentration:21399}  Recall:  not formally assessed ***  Fund of Knowledge: {BHH GOOD/FAIR/POOR:22877}  Language:  {BHH GOOD/FAIR/POOR:22877}  Psychomotor Activity:  {Psychomotor (PAA):22696}  Akathisia:  {BHH YES OR NO:22294}  AIMS (if indicated): {Desc; done/not:10129}  Assets:  {Assets (PAA):22698}  ADL's:  {BHH ZOX'W:96045}  Cognition: {chl bhh cognition:304700322}  Sleep:  {BHH GOOD/FAIR/POOR:22877}   PE: General: sits comfortably in view of camera; no acute distress *** Pulm: no increased work of breathing on room air *** MSK: all extremity movements appear intact *** Neuro: no focal neurological deficits observed *** Gait & Station: unable to assess by video ***   Metabolic Disorder Labs: Lab Results  Component Value Date   HGBA1C 5.7 (H) 07/25/2022   MPG 117 07/25/2022   No results found for: "PROLACTIN" Lab Results  Component Value Date   CHOL 191 07/25/2022   TRIG 91 07/25/2022   HDL 52 07/25/2022   CHOLHDL 3.7 07/25/2022   VLDL 18 07/25/2022   LDLCALC 121 (H) 07/25/2022   LDLCALC 59 10/08/2014   Lab Results  Component Value Date   TSH 1.689 07/25/2022    Therapeutic Level Labs:  No results found for: "LITHIUM" No results found for: "CBMZ" No results found for: "VALPROATE"  Screenings:  GAD-7    Flowsheet Row Office Visit from 07/25/2022 in [facility]  Total GAD-7 Score 21      PHQ2-9    Flowsheet Row Office Visit from 07/25/2022 in [facility]  PHQ-2 Total Score 3  PHQ-9 Total Score 24      Flowsheet Row ED from 07/25/2022 in [facility] ED from 01/12/2021 in [facility] ED from 06/09/2020 in [facility] RISK CATEGORY Low Risk No Risk Error: Question 6 not populated       Collaboration of Care: Collaboration of Care: [facility] OP Collaboration of Care:[id number]}  Patient/Guardian was advised Release of Information must be obtained prior to any record release in order to collaborate their care with an outside provider.  Patient/Guardian was advised if they have not already done so to contact the registration department to sign all necessary forms in order for [place] to release information regarding their care.   Consent: Patient/Guardian gives verbal consent for treatment and assignment of benefits for services provided during this visit. Patient/Guardian expressed understanding and agreed to proceed.   Televisit via video: I connected with Tricia Hood on 12/17/22 at  1:00 PM EST by a video enabled telemedicine application and verified that I am speaking with the correct person using two identifiers.  Location: Patient: *** Provider: remote office in [place]   I discussed the limitations of evaluation and management by telemedicine and the availability of in person appointments. The patient expressed understanding and agreed to proceed.  I discussed the assessment and treatment plan with the patient. The patient was provided an opportunity to ask questions and all were answered. The patient agreed with the plan and demonstrated an understanding of the instructions.   The patient was advised to call back or seek an in-person evaluation if the symptoms worsen or if the condition fails to improve as anticipated.  I provided *** minutes dedicated to the care of this patient via video on the date of this encounter to include chart review, face-to-face time with the patient, medication management/counseling ***.  Jayda White A Kerim Statzer 11/19/202410:55 AM

## 2022-12-18 ENCOUNTER — Encounter (HOSPITAL_COMMUNITY): Payer: 59 | Admitting: Psychiatry

## 2022-12-18 ENCOUNTER — Encounter (HOSPITAL_COMMUNITY): Payer: Self-pay

## 2022-12-23 ENCOUNTER — Telehealth (INDEPENDENT_AMBULATORY_CARE_PROVIDER_SITE_OTHER): Payer: Self-pay | Admitting: Primary Care

## 2022-12-23 NOTE — Telephone Encounter (Signed)
Called to remind pt about apt. Pt did not answer and VM was left.

## 2022-12-24 ENCOUNTER — Ambulatory Visit (INDEPENDENT_AMBULATORY_CARE_PROVIDER_SITE_OTHER): Payer: 59 | Admitting: Primary Care

## 2022-12-29 ENCOUNTER — Ambulatory Visit (INDEPENDENT_AMBULATORY_CARE_PROVIDER_SITE_OTHER): Payer: 59

## 2022-12-29 ENCOUNTER — Ambulatory Visit (HOSPITAL_COMMUNITY)
Admission: EM | Admit: 2022-12-29 | Discharge: 2022-12-29 | Disposition: A | Discharge status: Home / Self Care | Payer: 59 | Attending: Emergency Medicine | Admitting: Emergency Medicine

## 2022-12-29 ENCOUNTER — Encounter (HOSPITAL_COMMUNITY): Payer: Self-pay

## 2022-12-29 DIAGNOSIS — R059 Cough, unspecified: Secondary | ICD-10-CM | POA: Diagnosis not present

## 2022-12-29 DIAGNOSIS — R0602 Shortness of breath: Secondary | ICD-10-CM | POA: Diagnosis not present

## 2022-12-29 DIAGNOSIS — J4521 Mild intermittent asthma with (acute) exacerbation: Secondary | ICD-10-CM | POA: Diagnosis not present

## 2022-12-29 DIAGNOSIS — J069 Acute upper respiratory infection, unspecified: Secondary | ICD-10-CM

## 2022-12-29 LAB — POC COVID19/FLU A&B COMBO
Covid Antigen, POC: NEGATIVE
Influenza A Antigen, POC: NEGATIVE
Influenza B Antigen, POC: NEGATIVE

## 2022-12-29 MED ORDER — ALBUTEROL SULFATE HFA 108 (90 BASE) MCG/ACT IN AERS: 1.0000 | INHALATION_SPRAY | Freq: Four times a day (QID) | RESPIRATORY_TRACT | 0 refills | Status: DC | PRN

## 2022-12-29 MED ORDER — ACETAMINOPHEN 325 MG PO TABS
975.0000 mg | ORAL_TABLET | Freq: Once | ORAL | Status: AC
  Administered 2022-12-29: 975 mg via ORAL

## 2022-12-29 MED ORDER — ACETAMINOPHEN 325 MG PO TABS
ORAL_TABLET | ORAL | Status: AC
  Filled 2022-12-29: qty 3

## 2022-12-29 MED ORDER — PREDNISONE 20 MG PO TABS: 40.0000 mg | ORAL_TABLET | Freq: Every day | ORAL | 0 refills | Status: AC

## 2022-12-29 MED ORDER — PROMETHAZINE-DM 6.25-15 MG/5ML PO SYRP: 5.0000 mL | ORAL_SOLUTION | Freq: Four times a day (QID) | ORAL | 0 refills | Status: DC | PRN

## 2022-12-29 NOTE — Discharge Instructions (Signed)
Your COVID and flu testing were negative.  Your x-ray did not show any pneumonia.  You most likely have a different viral upper respiratory tract infection.  You are having wheezing, please take the steroids in the morning with breakfast to help with this.  You can also use the albuterol inhaler every 6 hours as needed.  Take the cough medicine as needed, do not drink or drive on this as it may cause drowsiness.  Ensure you are staying well-hydrated.  You can take Tylenol every 8 hours as needed for fever, aches and pains.  Your symptoms should improve with these medications and over the next 5 to 7 days.  If no improvement please follow-up with your primary care provider or return to clinic.

## 2022-12-29 NOTE — ED Triage Notes (Signed)
Pt presents with diarrhea,nausea, cough and chills x 3 days. Pt has not taken any medication to help.

## 2022-12-29 NOTE — ED Provider Notes (Signed)
[facility]    CSN: [id number] Arrival date & time: 12/29/22  1622      History   Chief Complaint Chief Complaint  Patient presents with   Cough   Nasal Congestion   Diarrhea   Nausea    HPI Tricia Hood is a 52 y.o. female.   Patient presents to clinic for complaints of diarrhea, nausea, nonproductive cough, sore throat, hot and cold chills, shortness of breath, wheezing, body aches, lower abdominal pain and rib cage pain for the past 3 days.  No emesis.  Has had a total of 3 episodes of diarrhea.  She has tried Robitussin and Vicks vapor rub as well as emergen-C without relief.  Is unable to take a deep breath without coughing.  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  She does have a history of asthma.  She does not have an albuterol inhaler.   The history is provided by the patient and medical records.  Cough Diarrhea   Past Medical History:  Diagnosis Date   Asthma     Patient Active Problem List   Diagnosis Date Noted   Pancreatitis 10/08/2014   Cellulitis 10/07/2014   Insect bite 10/07/2014   Asthma 10/07/2014   Abdominal pain 10/07/2014   Hypokalemia 10/07/2014    Past Surgical History:  Procedure Laterality Date   ANKLE FRACTURE SURGERY     TONSILLECTOMY     TUBAL LIGATION      OB History   No obstetric history on file.      Home Medications    Prior to Admission medications   Medication Sig Start Date End Date Taking? Authorizing Provider  albuterol (VENTOLIN HFA) 108 (90 Base) MCG/ACT inhaler Inhale 1-2 puffs into the lungs every 6 (six) hours as needed for wheezing or shortness of breath. 12/29/22  Yes Rinaldo Ratel, [place] N, FNP  ARIPiprazole (ABILIFY) 10 MG tablet Take 10 mg by mouth daily. 08/06/22  Yes [provider]  diazepam (VALIUM) 5 MG tablet Take one tablet by mouth with food one hour prior to procedure. May repeat 30 minutes prior if needed. 11/06/22   Yes Tyrell Antonio, MD  EPINEPHrine (EPIPEN 2-PAK) 0.3 mg/0.3 mL IJ SOAJ injection Inject 0.3 mLs (0.3 mg total) into the muscle as needed for anaphylaxis. 03/04/18  Yes Harlene Salts A, PA-C  gabapentin (NEURONTIN) 100 MG capsule Take 1 capsule (100 mg total) by mouth 2 (two) times daily. 11/01/22  Yes Grayce Sessions, NP  hydrOXYzine (VISTARIL) 25 MG capsule Take 1 capsule (25 mg total) by mouth every 8 (eight) hours as needed. 11/01/22  Yes Grayce Sessions, NP  predniSONE (DELTASONE) 20 MG tablet Take 2 tablets (40 mg total) by mouth daily for 5 days. 12/29/22 01/03/23 Yes Rinaldo Ratel, [place] N, FNP  promethazine-dextromethorphan (PROMETHAZINE-DM) 6.25-15 MG/5ML syrup Take 5 mLs by mouth 4 (four) times daily as needed for cough. 12/29/22  Yes Rinaldo Ratel, [place] N, FNP  traZODone (DESYREL) 50 MG tablet Take 1-2 tablets (50-100 mg total) by mouth at bedtime as needed. 11/01/22  Yes Grayce Sessions, NP    Family History Family History  Problem Relation Age of Onset   Hypertension Mother     Social History Social History   Tobacco Use   Smoking status: Every Day    Current packs/day: 1.00    Types: Cigarettes   Smokeless tobacco: Never  Vaping Use   Vaping status: Never Used  Substance Use  Topics   Alcohol use: No   Drug use: No     Allergies   Nsaids, Other, Shellfish allergy, and Citrus   Review of Systems Review of Systems  Per HPI   Physical Exam Triage Vital Signs ED Triage Vitals [12/29/22 1745]  Encounter Vitals Group     BP 120/78     Systolic BP Percentile      Diastolic BP Percentile      Pulse Rate 97     Resp 16     Temp 98.8 F (37.1 C)     Temp Source Oral     SpO2 97 %     Weight      Height      Head Circumference      Peak Flow      Pain Score      Pain Loc      Pain Education      Exclude from Growth Chart    No data found.  Updated Vital Signs BP 120/78 (BP Location: Left Arm)   Pulse 97   Temp 98.8 F (37.1 C) (Oral)    Resp 16   SpO2 97%   Visual Acuity Right Eye Distance:   Left Eye Distance:   Bilateral Distance:    Right Eye Near:   Left Eye Near:    Bilateral Near:     Physical Exam Vitals and nursing note reviewed.  Constitutional:      Appearance: Normal appearance.  HENT:     Head: Normocephalic and atraumatic.     Right Ear: External ear normal.     Left Ear: External ear normal.     Nose: Nose normal.     Mouth/Throat:     Mouth: Mucous membranes are moist.     Pharynx: No posterior oropharyngeal erythema.  Eyes:     Conjunctiva/sclera: Conjunctivae normal.  Cardiovascular:     Rate and Rhythm: Normal rate and regular rhythm.     Heart sounds: Normal heart sounds. No murmur heard. Pulmonary:     Effort: Pulmonary effort is normal.     Breath sounds: Wheezing present.  Musculoskeletal:        General: Normal range of motion.     Cervical back: Normal range of motion.  Skin:    General: Skin is warm and dry.  Neurological:     General: No focal deficit present.     Mental Status: She is alert and oriented to person, place, and time.  Psychiatric:        Mood and Affect: Mood normal.        Behavior: Behavior normal.      UC Treatments / Results  Labs (all labs ordered are listed, but only abnormal results are displayed) Labs Reviewed  POC COVID19/FLU A&B COMBO    EKG   Radiology No results found.  Procedures Procedures (including critical care time)  Medications Ordered in UC Medications  acetaminophen (TYLENOL) tablet 975 mg (975 mg Oral Given 12/29/22 1809)    Initial Impression / Assessment and Plan / UC Course  I have reviewed the triage vital signs and the nursing notes.  Pertinent labs & imaging results that were available during my care of the patient were reviewed by me and considered in my medical decision making (see chart for details).  Vitals and triage reviewed, patient is hemodynamically stable.  Heart with regular rate and rhythm,  posterior pharynx with erythema.  Expiratory wheezing on physical exam.  Imaging awaiting official  radiology overread, no obvious infiltrate.  POC COVID and flu testing negative.  Suspect other viral URI that has triggered an asthma exacerbation.  Will cover with steroid burst, provide albuterol inhaler and encouraged symptomatic management.  Plan of care, follow-up care return precautions given, no questions at this time.     Final Clinical Impressions(s) / UC Diagnoses   Final diagnoses:  Viral URI with cough  Mild intermittent asthma with acute exacerbation     Discharge Instructions      Your COVID and flu testing were negative.  Your x-ray did not show any pneumonia.  You most likely have a different viral upper respiratory tract infection.  You are having wheezing, please take the steroids in the morning with breakfast to help with this.  You can also use the albuterol inhaler every 6 hours as needed.  Take the cough medicine as needed, do not drink or drive on this as it may cause drowsiness.  Ensure you are staying well-hydrated.  You can take Tylenol every 8 hours as needed for fever, aches and pains.  Your symptoms should improve with these medications and over the next 5 to 7 days.  If no improvement please follow-up with your primary care provider or return to clinic.      ED Prescriptions     Medication Sig Dispense Auth. Provider   albuterol (VENTOLIN HFA) 108 (90 Base) MCG/ACT inhaler Inhale 1-2 puffs into the lungs every 6 (six) hours as needed for wheezing or shortness of breath. 18 g Rinaldo Ratel, [place] N, [place]   predniSONE (DELTASONE) 20 MG tablet Take 2 tablets (40 mg total) by mouth daily for 5 days. 10 tablet Rinaldo Ratel, [place] N, [place]   promethazine-dextromethorphan (PROMETHAZINE-DM) 6.25-15 MG/5ML syrup Take 5 mLs by mouth 4 (four) times daily as needed for cough. 118 mL Dutch Ing, [place] N, [place]      PDMP not reviewed this encounter.   Muneeb Veras, [place] N,  [place] 12/29/22 [phone]

## 2023-01-10 ENCOUNTER — Ambulatory Visit (HOSPITAL_COMMUNITY): Payer: 59 | Admitting: Mental Health

## 2023-02-05 ENCOUNTER — Ambulatory Visit (INDEPENDENT_AMBULATORY_CARE_PROVIDER_SITE_OTHER): Payer: 59 | Admitting: Primary Care

## 2023-03-04 ENCOUNTER — Telehealth (INDEPENDENT_AMBULATORY_CARE_PROVIDER_SITE_OTHER): Payer: Self-pay | Admitting: Primary Care

## 2023-03-04 NOTE — Telephone Encounter (Signed)
Called pr to remind them about atp. VM left

## 2023-03-06 NOTE — Telephone Encounter (Signed)
 Copied from CRM [id number]. Topic: Clinical - Request for Lab/Test Order >> Mar 04, 2023  4:02 PM Lizabeth Riggs wrote: Reason for CRM: She needs to get a TB test for work and wants to come in Wednesday or Thursday of this week. Please call her at [phone]

## 2023-03-11 ENCOUNTER — Ambulatory Visit (INDEPENDENT_AMBULATORY_CARE_PROVIDER_SITE_OTHER): Payer: 59 | Admitting: Primary Care

## 2023-03-11 ENCOUNTER — Telehealth (INDEPENDENT_AMBULATORY_CARE_PROVIDER_SITE_OTHER): Payer: Self-pay | Admitting: Primary Care

## 2023-03-11 NOTE — Telephone Encounter (Signed)
Called pt to see if she was interested in rescheduling. I was able to reschedule for another atp.

## 2023-03-19 ENCOUNTER — Ambulatory Visit (INDEPENDENT_AMBULATORY_CARE_PROVIDER_SITE_OTHER): Payer: 59 | Admitting: Primary Care

## 2023-04-01 ENCOUNTER — Ambulatory Visit: Admitting: Internal Medicine

## 2023-04-01 ENCOUNTER — Ambulatory Visit (INDEPENDENT_AMBULATORY_CARE_PROVIDER_SITE_OTHER): Payer: Self-pay | Admitting: Primary Care

## 2023-04-01 NOTE — Telephone Encounter (Signed)
 Copied from CRM [id number]. Topic: Clinical - Red Word Triage >> Apr 01, 2023 10:09 AM Everette Rank wrote: Red Word that prompted transfer to Nurse Triage: STD issue/Discharged-Funny smell  Chief Complaint: vaginal discharge  Symptoms: burning sensation, bad odor, discharge, itching inside vagina, abd cramping at times Frequency: yesterday Pertinent Negatives: Patient denies bloody urine or fever Disposition: [] ED /[] Urgent Care (no appt availability in office) / [x] Appointment(In office/virtual)/ []  [facility]/ [] Home Care/ [] Refused Recommended Disposition /[] [facility]/ []  Follow-up with PCP Additional Notes: pt stated bad vaginal odor, discharge, burning and itching all of a sudden.  Would like evaluation and treatment  Reason for Disposition  Bad smelling vaginal discharge  Answer Assessment - Initial Assessment Questions 1. DISCHARGE: "Describe the discharge." (e.g., white, yellow, green, gray, foamy, cottage cheese-like)     Discharge light clear to grey - thin 2. ODOR: "Is there a bad odor?"     Bad odor 3. ONSET: "When did the discharge begin?"     yesterday 4. RASH: "Is there a rash in the genital area?" If Yes, ask: "Describe it." (e.g., redness, blisters, sores, bumps)     itching 5. ABDOMEN PAIN: "Are you having any abdomen pain?" If Yes, ask: "What does it feel like? " (e.g., crampy, dull, intermittent, constant)      Abd cramping 6. ABDOMEN PAIN SEVERITY: If present, ask: "How bad is it?" (e.g., Scale 1-10; mild, moderate, or severe)   - MILD (1-3): Doesn't interfere with normal activities, abdomen soft and not tender to touch.    - MODERATE (4-7): Interferes with normal activities or awakens from sleep, abdomen tender to touch.    - SEVERE (8-10): Excruciating pain, doubled over, unable to do any normal activities. (R/O peritonitis)      mild 7. CAUSE: "What do you think is causing the discharge?" "Have you had the same problem before? What happened  then?"     Unknown 8. OTHER SYMPTOMS: "Do you have any other symptoms?" (e.g., fever, itching, vaginal bleeding, pain with urination, injury to genital area, vaginal foreign body)     Burn  9. PREGNANCY: "Is there any chance you are pregnant?" "When was your last menstrual period?"     N/a  Protocols used: Vaginal Discharge-A-AH

## 2023-04-07 ENCOUNTER — Ambulatory Visit (INDEPENDENT_AMBULATORY_CARE_PROVIDER_SITE_OTHER)

## 2023-04-18 ENCOUNTER — Encounter: Payer: Self-pay | Admitting: Nurse Practitioner

## 2023-04-18 ENCOUNTER — Ambulatory Visit: Payer: MEDICAID | Attending: Nurse Practitioner | Admitting: Nurse Practitioner

## 2023-04-18 VITALS — BP 128/86 | HR 72 | Ht 66.0 in | Wt 202.6 lb

## 2023-04-18 DIAGNOSIS — R7303 Prediabetes: Secondary | ICD-10-CM

## 2023-04-18 DIAGNOSIS — M47816 Spondylosis without myelopathy or radiculopathy, lumbar region: Secondary | ICD-10-CM

## 2023-04-18 DIAGNOSIS — F4323 Adjustment disorder with mixed anxiety and depressed mood: Secondary | ICD-10-CM

## 2023-04-18 DIAGNOSIS — Z91030 Bee allergy status: Secondary | ICD-10-CM

## 2023-04-18 DIAGNOSIS — J45909 Unspecified asthma, uncomplicated: Secondary | ICD-10-CM

## 2023-04-18 DIAGNOSIS — E669 Obesity, unspecified: Secondary | ICD-10-CM

## 2023-04-18 MED ORDER — METHOCARBAMOL 750 MG PO TABS: 750.0000 mg | ORAL_TABLET | Freq: Three times a day (TID) | ORAL | 1 refills | Status: AC | PRN

## 2023-04-18 MED ORDER — ALBUTEROL SULFATE HFA 108 (90 BASE) MCG/ACT IN AERS: 1.0000 | INHALATION_SPRAY | Freq: Four times a day (QID) | RESPIRATORY_TRACT | 1 refills | Status: AC | PRN

## 2023-04-18 MED ORDER — GABAPENTIN 300 MG PO CAPS: 300.0000 mg | ORAL_CAPSULE | Freq: Three times a day (TID) | ORAL | 1 refills | Status: DC

## 2023-04-18 MED ORDER — HYDROXYZINE PAMOATE 25 MG PO CAPS: 25.0000 mg | ORAL_CAPSULE | Freq: Three times a day (TID) | ORAL | 1 refills | Status: DC | PRN

## 2023-04-18 MED ORDER — EPINEPHRINE 0.3 MG/0.3ML IJ SOAJ: 0.3000 mg | INTRAMUSCULAR | 1 refills | Status: AC | PRN

## 2023-04-18 NOTE — Patient Instructions (Addendum)
 [facility]  [address] [place], [place] [address] PH# [phone]    [facility] [address], [place], [place] [address] Phone: [phone]  [facility].  Phone: [phone].  Address: [address] [place], [place] [address].

## 2023-04-18 NOTE — Progress Notes (Signed)
 Wants to loss weight with medicine.

## 2023-04-18 NOTE — Progress Notes (Signed)
 Assessment & Plan:  Tricia Hood was seen today for weight loss.  Diagnoses and all orders for this visit:  Prediabetes -     Hemoglobin A1c -     CMP14+EGFR  Adjustment disorder with mixed anxiety and depressed mood -     hydrOXYzine (VISTARIL) 25 MG capsule; Take 1 capsule (25 mg total) by mouth every 8 (eight) hours as needed. -     gabapentin (NEURONTIN) 300 MG capsule; Take 1 capsule (300 mg total) by mouth 3 (three) times daily.  Intrinsic asthma Symptoms well-controlled -     albuterol (VENTOLIN HFA) 108 (90 Base) MCG/ACT inhaler; Inhale 1-2 puffs into the lungs every 6 (six) hours as needed for wheezing or shortness of breath.  Lumbar facet arthropathy -     gabapentin (NEURONTIN) 300 MG capsule; Take 1 capsule (300 mg total) by mouth 3 (three) times daily. -     methocarbamol (ROBAXIN) 750 MG tablet; Take 1 tablet (750 mg total) by mouth every 8 (eight) hours as needed for muscle spasms.  Obesity (BMI 30-39.9) -     Amb Ref to Medical Weight Management Discussed diet and exercise for person with BMI >32. Instructed: You must burn more calories than you eat. Losing 5 percent of your body weight should be considered a success. In the longer term, losing more than 15 percent of your body weight and staying at this weight is an extremely good result. However, keep in mind that even losing 5 percent of your body weight leads to important health benefits, so try not to get discouraged if you're not able to lose more than this.   Allergy to honey bee venom -     EPINEPHrine (EPIPEN 2-PAK) 0.3 mg/0.3 mL IJ SOAJ injection; Inject 0.3 mg into the muscle as needed for anaphylaxis.    Patient has been counseled on age-appropriate routine health concerns for screening and prevention. These are reviewed and up-to-date. Referrals have been placed accordingly. Immunizations are up-to-date or declined.    Subjective:   Chief Complaint  Patient presents with   Weight Loss    Tricia Hood 53 y.o. female presents to office today requesting refills and weight loss medication. She is a patient of Tricia Hood, ANP  She has a past medical history of Asthma, lumbar facet arthropathy, psychotic disorder and prediabetes.  Tricia Hood who has a BMI of 32.70 and is requesting weight loss medication today.  States although she wants to work out it is very difficult to exercise due to her chronic back pain.  She does try to watch what she eats and typically weight fluctuates between 205 to 215 pounds  Back pain She has chronic back pain with left-sided sciatica.  Requesting muscle relaxant today.  She is also currently taking gabapentin 100 mg twice daily which she states is ineffective.  Requesting Abilify and diazepam.  I have declined refilling these for her and instead have referred her to behavioral health.  She does not endorse any thoughts of self-harm    Review of Systems  Constitutional:  Negative for fever, malaise/fatigue and weight loss.  HENT: Negative.  Negative for nosebleeds.   Eyes: Negative.  Negative for blurred vision, double vision and photophobia.  Respiratory: Negative.  Negative for cough and shortness of breath.   Cardiovascular: Negative.  Negative for chest pain, palpitations and leg swelling.  Gastrointestinal: Negative.  Negative for heartburn, nausea and vomiting.  Musculoskeletal:  Positive for back pain, joint pain and myalgias.  Neurological: Negative.  Negative for dizziness, focal weakness, seizures and headaches.  Psychiatric/Behavioral:  Negative for suicidal ideas. The patient is nervous/anxious.     Past Medical History:  Diagnosis Date   Asthma     Past Surgical History:  Procedure Laterality Date   ANKLE FRACTURE SURGERY     TONSILLECTOMY     TUBAL LIGATION      Family History  Problem Relation Age of Onset   Hypertension Mother     Social History Reviewed with no changes to be made today.   Outpatient Medications  Prior to Visit  Medication Sig Dispense Refill   traZODone (DESYREL) 50 MG tablet Take 1-2 tablets (50-100 mg total) by mouth at bedtime as needed. 90 tablet 1   gabapentin (NEURONTIN) 100 MG capsule Take 1 capsule (100 mg total) by mouth 2 (two) times daily. 180 capsule 1   ARIPiprazole (ABILIFY) 10 MG tablet Take 10 mg by mouth daily. (Patient not taking: Reported on 04/18/2023)     diazepam (VALIUM) 5 MG tablet Take one tablet by mouth with food one hour prior to procedure. May repeat 30 minutes prior if needed. (Patient not taking: Reported on 04/18/2023) 2 tablet 0   albuterol (VENTOLIN HFA) 108 (90 Base) MCG/ACT inhaler Inhale 1-2 puffs into the lungs every 6 (six) hours as needed for wheezing or shortness of breath. (Patient not taking: Reported on 04/18/2023) 18 g 0   EPINEPHrine (EPIPEN 2-PAK) 0.3 mg/0.3 mL IJ SOAJ injection Inject 0.3 mLs (0.3 mg total) into the muscle as needed for anaphylaxis. (Patient not taking: Reported on 04/18/2023) 1 Device 0   hydrOXYzine (VISTARIL) 25 MG capsule Take 1 capsule (25 mg total) by mouth every 8 (eight) hours as needed. (Patient not taking: Reported on 04/18/2023) 90 capsule 1   promethazine-dextromethorphan (PROMETHAZINE-DM) 6.25-15 MG/5ML syrup Take 5 mLs by mouth 4 (four) times daily as needed for cough. (Patient not taking: Reported on 04/18/2023) 118 mL 0   No facility-administered medications prior to visit.    Allergies  Allergen Reactions   Nsaids Anaphylaxis and Other (See Comments)    Acetaminophen/Tylenol tolerated well per pt   Other Anaphylaxis, Hives and Other (See Comments)    Organic food   Shellfish Allergy Anaphylaxis and Hives   Citrus Rash       Objective:    BP 128/86 (BP Location: Left Arm, Patient Position: Sitting, Cuff Size: Normal)   Pulse 72   Ht 5\' 6"  (1.676 m)   Wt 202 lb 9.6 oz (91.9 kg)   SpO2 100%   BMI 32.70 kg/m  Wt Readings from Last 3 Encounters:  04/18/23 202 lb 9.6 oz (91.9 kg)  07/25/22 204 lb (92.5  kg)  06/03/16 215 lb (97.5 kg)    Physical Exam Vitals and nursing note reviewed.  Constitutional:      Appearance: She is well-developed.  HENT:     Head: Normocephalic and atraumatic.  Cardiovascular:     Rate and Rhythm: Normal rate and regular rhythm.     Heart sounds: Normal heart sounds. No murmur heard.    No friction rub. No gallop.  Pulmonary:     Effort: Pulmonary effort is normal. No tachypnea or respiratory distress.     Breath sounds: Normal breath sounds. No decreased breath sounds, wheezing, rhonchi or rales.  Chest:     Chest wall: No tenderness.  Abdominal:     General: Bowel sounds are normal.     Palpations: Abdomen is soft.  Musculoskeletal:  General: Normal range of motion.     Cervical back: Normal range of motion.  Skin:    General: Skin is warm and dry.  Neurological:     Mental Status: She is alert and oriented to person, place, and time.     Coordination: Coordination normal.  Psychiatric:        Behavior: Behavior normal. Behavior is cooperative.        Thought Content: Thought content normal.        Judgment: Judgment normal.          Patient has been counseled extensively about nutrition and exercise as well as the importance of adherence with medications and regular follow-up. The patient was given clear instructions to go to ER or return to medical center if symptoms don't improve, worsen or new problems develop. The patient verbalized understanding.   Follow-up: Return for see michelle edwards in 3 months.   Claiborne Rigg, FNP-BC [facility] [place], [place] [phone]   04/18/2023, 11:13 AM

## 2023-04-19 LAB — CMP14+EGFR
ALT: 20 IU/L (ref 0–32)
AST: 18 IU/L (ref 0–40)
Albumin: 4.4 g/dL (ref 3.8–4.9)
Alkaline Phosphatase: 74 IU/L (ref 44–121)
BUN/Creatinine Ratio: 11 (ref 9–23)
BUN: 7 mg/dL (ref 6–24)
Bilirubin Total: <0.2 mg/dL (ref 0.0–1.2)
CO2: 26 mmol/L (ref 20–29)
Calcium: 9.8 mg/dL (ref 8.7–10.2)
Chloride: 103 mmol/L (ref 96–106)
Creatinine, Ser: 0.61 mg/dL (ref 0.57–1.00)
Globulin, Total: 2.7 g/dL (ref 1.5–4.5)
Glucose: 94 mg/dL (ref 70–99)
Potassium: 4.8 mmol/L (ref 3.5–5.2)
Sodium: 141 mmol/L (ref 134–144)
Total Protein: 7.1 g/dL (ref 6.0–8.5)
eGFR: 107 mL/min/{1.73_m2} (ref >59)

## 2023-04-19 LAB — HEMOGLOBIN A1C
Est. average glucose Bld gHb Est-mCnc: 123 mg/dL
Hgb A1c MFr Bld: 5.9 % — ABNORMAL HIGH (ref 4.8–5.6)

## 2023-06-09 ENCOUNTER — Telehealth (INDEPENDENT_AMBULATORY_CARE_PROVIDER_SITE_OTHER): Payer: Self-pay | Admitting: Primary Care

## 2023-06-09 NOTE — Telephone Encounter (Signed)
 Called pt but could not lVM because the mailbox was full

## 2023-06-10 ENCOUNTER — Ambulatory Visit (INDEPENDENT_AMBULATORY_CARE_PROVIDER_SITE_OTHER): Payer: MEDICAID | Admitting: Primary Care

## 2023-07-18 ENCOUNTER — Ambulatory Visit (INDEPENDENT_AMBULATORY_CARE_PROVIDER_SITE_OTHER): Payer: MEDICAID | Admitting: Primary Care

## 2023-08-04 ENCOUNTER — Ambulatory Visit: Payer: Self-pay

## 2023-08-04 NOTE — Telephone Encounter (Signed)
 FYI Only or Action Required?: Action required by provider: request for appointment and medication refill request.  Patient was last seen in primary care on 04/18/2023 by Theotis Haze ORN, NP. Called Nurse Triage reporting Facial Pain. Symptoms began several weeks ago. Interventions attempted: Nothing. Symptoms are: gradually worsening.  Triage Disposition: See HCP Within 4 Hours (Or PCP Triage)  Patient/caregiver understands and will follow disposition?: Yes, will follow disposition  Pt needs appt, pt would like abx.   Copied from CRM [id number]. Topic: Clinical - Red Word Triage >> Aug 04, 2023 10:55 AM Tobias L wrote: Red Word that prompted transfer to Nurse Triage:  Pt having headache and earache, possible sinus infection, rating pain at 9 Reason for Disposition  [1] Redness or swelling on the cheek, forehead or around the eye AND [2] no fever  Answer Assessment - Initial Assessment Questions 1. LOCATION: Where does it hurt?      Eyes, nose 2. ONSET: When did the sinus pain start?  (e.g., hours, days)      2 weeks 3. SEVERITY: How bad is the pain?   (Scale 1-10; mild, moderate or severe)   - MILD (1-3): doesn't interfere with normal activities    - MODERATE (4-7): interferes with normal activities (e.g., work or school) or awakens from sleep   - SEVERE (8-10): excruciating pain and patient unable to do any normal activities        9 4. RECURRENT SYMPTOM: Have you ever had sinus problems before? If Yes, ask: When was the last time? and What happened that time?      Sinus infection 5. NASAL CONGESTION: Is the nose blocked? If Yes, ask: Can you open it or must you breathe through your mouth?     Yes for about 2 wks 6. NASAL DISCHARGE: Do you have discharge from your nose? If so ask, What color?     denies 7. FEVER: Do you have a fever? If Yes, ask: What is it, how was it measured, and when did it start?      denies 8. OTHER SYMPTOMS: Do you have any other  symptoms? (e.g., sore throat, cough, earache, difficulty breathing)     HA, swelling of the face, earache  Protocols used: Sinus Pain or Congestion-A-AH

## 2023-10-01 ENCOUNTER — Telehealth (INDEPENDENT_AMBULATORY_CARE_PROVIDER_SITE_OTHER): Payer: Self-pay | Admitting: Primary Care

## 2023-10-01 NOTE — Telephone Encounter (Signed)
 Called pt to confirm appt. Pt will be present.

## 2023-10-02 ENCOUNTER — Ambulatory Visit (INDEPENDENT_AMBULATORY_CARE_PROVIDER_SITE_OTHER): Payer: MEDICAID | Admitting: Primary Care

## 2024-02-18 ENCOUNTER — Ambulatory Visit: Payer: Self-pay

## 2024-02-18 ENCOUNTER — Ambulatory Visit (HOSPITAL_COMMUNITY)
Admission: EM | Admit: 2024-02-18 | Discharge: 2024-02-18 | Disposition: A | Discharge status: Home / Self Care | Payer: MEDICAID

## 2024-02-18 NOTE — Telephone Encounter (Signed)
 Noted.

## 2024-02-18 NOTE — Telephone Encounter (Signed)
 FYI Only or Action Required?: FYI only for provider: ED advised.  Patient was last seen in primary care on 04/18/2023 by Theotis Haze ORN, NP.  Called Nurse Triage reporting Headache.  Symptoms began several days ago.  Interventions attempted: Nothing.  Symptoms are: gradually worsening.  Triage Disposition: Go to ED Now (or PCP Triage)  Patient/caregiver understands and will follow disposition?: No, refuses disposition  Reason for Disposition  Taking a deep breath makes pain worse  [1] MODERATE headache (e.g., interferes with normal activities) AND [2] present > 24 hours AND [3] unexplained  (Exceptions: Pain medicines not tried, typical migraine, or headache part of viral illness.)  Answer Assessment - Initial Assessment Questions CAL notified of ED refusal. Pt states will call 911 if she gets worse   1. LOCATION: Where does it hurt?       chest 2. RADIATION: Does the pain go anywhere else? (e.g., into neck, jaw, arms, back)     no 3. ONSET: When did the chest pain begin? (Minutes, hours or days)      3 days ago 4. PATTERN: Does the pain come and go, or has it been constant since it started?  Does it get worse with exertion?      constant 5. DURATION: How long does it last (e.g., seconds, minutes, hours)      6. SEVERITY: How bad is the pain?  (e.g., Scale 1-10; mild, moderate, or severe)     Mild moderate 7. CARDIAC RISK FACTORS: Do you have any history of heart problems or risk factors for heart disease? (e.g., angina, prior heart attack; diabetes, high blood pressure, high cholesterol, smoker, or strong family history of heart disease)      8. PULMONARY RISK FACTORS: Do you have any history of lung disease?  (e.g., blood clots in lung, asthma, emphysema, birth control pills)      9. CAUSE: What do you think is causing the chest pain?      10. OTHER SYMPTOMS: Do you have any other symptoms? (e.g., dizziness, nausea, vomiting, sweating, fever,  difficulty breathing, cough)       Ha, sob with activity 11. PREGNANCY: Is there any chance you are pregnant? When was your last menstrual period?  Answer Assessment - Initial Assessment Questions 1. LOCATION: Where does it hurt?      general 2. ONSET: When did the headache start? (e.g., minutes, hours, days)      Several days ago 3. PATTERN: Does the pain come and go, or has it been constant since it started?     constant 4. SEVERITY: How bad is the pain? and What does it keep you from doing?  (e.g., Scale 1-10; mild, moderate, or severe)     moderate 5. RECURRENT SYMPTOM: Have you ever had headaches before? If Yes, ask: When was the last time? and What happened that time?       6. CAUSE: What do you think is causing the headache?     High BP, hasn't checked today 7. MIGRAINE: Have you been diagnosed with migraine headaches? If Yes, ask: Is this headache similar?       8. HEAD INJURY: Has there been any recent injury to your head?       9. OTHER SYMPTOMS: Do you have any other symptoms? (e.g., fever, stiff neck, eye pain, sore throat, cold symptoms)     cp  Protocols used: Headache-A-AH, Chest Pain-A-AH

## 2024-02-18 NOTE — Telephone Encounter (Signed)
 Outbound call placed to patient to discuss further.  Unable to reach left message , called back and reached patient but patient states can you call back I am at social services appointment and requesting call back after 330 PM today.      Message from [facility] sent at 02/18/2024  2:08 PM EST  Reason for Triage: headaches, bp 225/120 last tuesday - on 1/6, back ache, tired, see black specks

## 2024-02-19 ENCOUNTER — Ambulatory Visit (HOSPITAL_COMMUNITY): Payer: MEDICAID

## 2024-02-19 ENCOUNTER — Encounter (HOSPITAL_COMMUNITY): Payer: Self-pay

## 2024-02-19 DIAGNOSIS — F4310 Post-traumatic stress disorder, unspecified: Secondary | ICD-10-CM

## 2024-02-19 DIAGNOSIS — F411 Generalized anxiety disorder: Secondary | ICD-10-CM

## 2024-02-19 DIAGNOSIS — F3162 Bipolar disorder, current episode mixed, moderate: Secondary | ICD-10-CM
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
## 2024-02-20 ENCOUNTER — Encounter (HOSPITAL_COMMUNITY): Payer: Self-pay | Admitting: Physician Assistant

## 2024-02-20 ENCOUNTER — Ambulatory Visit (HOSPITAL_COMMUNITY): Payer: MEDICAID | Admitting: Physician Assistant

## 2024-02-20 VITALS — BP 121/88 | HR 83 | Temp 98.1°F | Ht 65.0 in | Wt 211.6 lb

## 2024-02-20 DIAGNOSIS — F4310 Post-traumatic stress disorder, unspecified: Secondary | ICD-10-CM

## 2024-02-20 DIAGNOSIS — F411 Generalized anxiety disorder: Secondary | ICD-10-CM | POA: Diagnosis not present

## 2024-02-20 DIAGNOSIS — F3162 Bipolar disorder, current episode mixed, moderate: Secondary | ICD-10-CM | POA: Diagnosis not present

## 2024-02-20 MED ORDER — GABAPENTIN 100 MG PO CAPS: 100.0000 mg | ORAL_CAPSULE | Freq: Three times a day (TID) | ORAL | 1 refills | Status: AC

## 2024-02-20 MED ORDER — ARIPIPRAZOLE 10 MG PO TABS: 10.0000 mg | ORAL_TABLET | Freq: Every day | ORAL | 1 refills | Status: AC

## 2024-02-20 MED ORDER — TRAZODONE HCL 50 MG PO TABS: 50.0000 mg | ORAL_TABLET | Freq: Every evening | ORAL | 1 refills | Status: AC | PRN

## 2024-02-20 MED ORDER — TRAZODONE HCL 50 MG PO TABS: 50.0000 mg | ORAL_TABLET | Freq: Every evening | ORAL | 1 refills | Status: DC | PRN

## 2024-02-20 MED ORDER — HYDROXYZINE PAMOATE 25 MG PO CAPS: 25.0000 mg | ORAL_CAPSULE | Freq: Three times a day (TID) | ORAL | 1 refills | Status: AC | PRN

## 2024-02-20 MED ORDER — GABAPENTIN 100 MG PO CAPS: 100.0000 mg | ORAL_CAPSULE | Freq: Three times a day (TID) | ORAL | 1 refills | Status: DC

## 2024-02-20 MED ORDER — HYDROXYZINE PAMOATE 25 MG PO CAPS: 25.0000 mg | ORAL_CAPSULE | Freq: Three times a day (TID) | ORAL | 1 refills | Status: DC | PRN

## 2024-02-20 MED ORDER — ARIPIPRAZOLE 10 MG PO TABS: 10.0000 mg | ORAL_TABLET | Freq: Every day | ORAL | 1 refills | Status: DC

## 2024-02-20 MED ORDER — ARIPIPRAZOLE 5 MG PO TABS: ORAL_TABLET | ORAL | 0 refills | Status: AC

## 2024-02-20 MED ORDER — ARIPIPRAZOLE 5 MG PO TABS: ORAL_TABLET | ORAL | 0 refills | Status: DC
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
## 2024-03-03 ENCOUNTER — Ambulatory Visit (INDEPENDENT_AMBULATORY_CARE_PROVIDER_SITE_OTHER): Payer: MEDICAID

## 2024-03-03 DIAGNOSIS — F4310 Post-traumatic stress disorder, unspecified: Secondary | ICD-10-CM

## 2024-03-03 DIAGNOSIS — F411 Generalized anxiety disorder: Secondary | ICD-10-CM

## 2024-03-03 DIAGNOSIS — F3162 Bipolar disorder, current episode mixed, moderate: Secondary | ICD-10-CM
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
## 2024-03-10 ENCOUNTER — Ambulatory Visit (HOSPITAL_COMMUNITY): Payer: MEDICAID

## 2024-04-13 ENCOUNTER — Encounter (HOSPITAL_COMMUNITY): Payer: MEDICAID
